# Patient Record
Sex: Male | Born: 1950 | Race: White | Hispanic: No | Marital: Married | Smoking: Never smoker
Health system: Southern US, Community
[De-identification: ages and names within clinical notes are randomized; demographics above are authoritative.]

## PROBLEM LIST (undated history)

## (undated) DIAGNOSIS — I639 Cerebral infarction, unspecified: Secondary | ICD-10-CM

## (undated) DIAGNOSIS — I10 Essential (primary) hypertension: Secondary | ICD-10-CM

## (undated) DIAGNOSIS — E119 Type 2 diabetes mellitus without complications: Secondary | ICD-10-CM

## (undated) HISTORY — PX: BRAIN SURGERY: SHX531

## (undated) HISTORY — PX: CARDIAC SURGERY: SHX584

---

## 2005-08-02 ENCOUNTER — Emergency Department: Payer: Self-pay | Admitting: Unknown Physician Specialty

## 2005-11-27 ENCOUNTER — Inpatient Hospital Stay: Payer: Self-pay | Admitting: Internal Medicine

## 2005-11-27 ENCOUNTER — Other Ambulatory Visit: Payer: Self-pay

## 2006-01-22 ENCOUNTER — Encounter: Payer: Self-pay | Admitting: Cardiology

## 2006-02-22 ENCOUNTER — Encounter: Payer: Self-pay | Admitting: Cardiology

## 2006-03-24 ENCOUNTER — Encounter: Payer: Self-pay | Admitting: Cardiology

## 2006-04-24 ENCOUNTER — Encounter: Payer: Self-pay | Admitting: Cardiology

## 2006-08-09 ENCOUNTER — Ambulatory Visit: Payer: Self-pay | Admitting: Unknown Physician Specialty

## 2006-09-04 ENCOUNTER — Ambulatory Visit: Payer: Self-pay | Admitting: Cardiology

## 2006-11-04 ENCOUNTER — Ambulatory Visit: Payer: Self-pay | Admitting: Internal Medicine

## 2006-11-12 ENCOUNTER — Ambulatory Visit: Payer: Self-pay | Admitting: Internal Medicine

## 2007-06-29 ENCOUNTER — Other Ambulatory Visit: Payer: Self-pay

## 2007-06-29 ENCOUNTER — Observation Stay: Payer: Self-pay | Admitting: Internal Medicine

## 2008-05-31 ENCOUNTER — Ambulatory Visit: Payer: Self-pay | Admitting: Family Medicine

## 2010-11-14 ENCOUNTER — Other Ambulatory Visit: Payer: Self-pay | Admitting: Physician Assistant

## 2011-05-31 ENCOUNTER — Ambulatory Visit: Payer: Self-pay | Admitting: Family Medicine

## 2012-04-04 ENCOUNTER — Ambulatory Visit: Payer: Self-pay | Admitting: Emergency Medicine

## 2012-04-04 LAB — URINALYSIS, COMPLETE
Bacteria: NEGATIVE
Blood: NEGATIVE
Ketone: NEGATIVE
Ph: 5 (ref 4.5–8.0)

## 2012-04-04 LAB — DOT URINE DIP
Blood: NEGATIVE
Glucose,UR: NEGATIVE mg/dL (ref 0–75)
Specific Gravity: >=1.03 (ref 1.003–1.030)

## 2018-08-27 ENCOUNTER — Inpatient Hospital Stay
Admission: EM | Admit: 2018-08-27 | Discharge: 2018-08-31 | DRG: 682 | Disposition: A | Discharge status: Other Acute Inpatient Hospital | Payer: Medicare Other | Attending: Internal Medicine | Admitting: Internal Medicine

## 2018-08-27 ENCOUNTER — Encounter: Payer: Self-pay | Admitting: Emergency Medicine

## 2018-08-27 ENCOUNTER — Emergency Department: Payer: Medicare Other

## 2018-08-27 ENCOUNTER — Other Ambulatory Visit: Payer: Self-pay

## 2018-08-27 ENCOUNTER — Inpatient Hospital Stay: Payer: Medicare Other

## 2018-08-27 DIAGNOSIS — Z79899 Other long term (current) drug therapy: Secondary | ICD-10-CM

## 2018-08-27 DIAGNOSIS — F05 Delirium due to known physiological condition: Secondary | ICD-10-CM | POA: Diagnosis not present

## 2018-08-27 DIAGNOSIS — Z794 Long term (current) use of insulin: Secondary | ICD-10-CM | POA: Diagnosis not present

## 2018-08-27 DIAGNOSIS — N179 Acute kidney failure, unspecified: Secondary | ICD-10-CM | POA: Diagnosis present

## 2018-08-27 DIAGNOSIS — N170 Acute kidney failure with tubular necrosis: Principal | ICD-10-CM | POA: Diagnosis present

## 2018-08-27 DIAGNOSIS — N3000 Acute cystitis without hematuria: Secondary | ICD-10-CM | POA: Diagnosis present

## 2018-08-27 DIAGNOSIS — E860 Dehydration: Secondary | ICD-10-CM | POA: Diagnosis present

## 2018-08-27 DIAGNOSIS — E870 Hyperosmolality and hypernatremia: Secondary | ICD-10-CM | POA: Diagnosis not present

## 2018-08-27 DIAGNOSIS — Z6822 Body mass index (BMI) 22.0-22.9, adult: Secondary | ICD-10-CM

## 2018-08-27 DIAGNOSIS — I639 Cerebral infarction, unspecified: Secondary | ICD-10-CM

## 2018-08-27 DIAGNOSIS — G9341 Metabolic encephalopathy: Secondary | ICD-10-CM | POA: Diagnosis present

## 2018-08-27 DIAGNOSIS — R6521 Severe sepsis with septic shock: Secondary | ICD-10-CM | POA: Diagnosis present

## 2018-08-27 DIAGNOSIS — I10 Essential (primary) hypertension: Secondary | ICD-10-CM | POA: Diagnosis present

## 2018-08-27 DIAGNOSIS — E872 Acidosis, unspecified: Secondary | ICD-10-CM

## 2018-08-27 DIAGNOSIS — Z800 Family history of malignant neoplasm of digestive organs: Secondary | ICD-10-CM | POA: Diagnosis not present

## 2018-08-27 DIAGNOSIS — E1140 Type 2 diabetes mellitus with diabetic neuropathy, unspecified: Secondary | ICD-10-CM | POA: Diagnosis present

## 2018-08-27 DIAGNOSIS — G911 Obstructive hydrocephalus: Secondary | ICD-10-CM | POA: Diagnosis not present

## 2018-08-27 DIAGNOSIS — E875 Hyperkalemia: Secondary | ICD-10-CM | POA: Diagnosis present

## 2018-08-27 DIAGNOSIS — E43 Unspecified severe protein-calorie malnutrition: Secondary | ICD-10-CM

## 2018-08-27 DIAGNOSIS — R569 Unspecified convulsions: Secondary | ICD-10-CM | POA: Diagnosis not present

## 2018-08-27 DIAGNOSIS — Z7982 Long term (current) use of aspirin: Secondary | ICD-10-CM | POA: Diagnosis not present

## 2018-08-27 DIAGNOSIS — I69334 Monoplegia of upper limb following cerebral infarction affecting left non-dominant side: Secondary | ICD-10-CM

## 2018-08-27 DIAGNOSIS — E876 Hypokalemia: Secondary | ICD-10-CM | POA: Diagnosis not present

## 2018-08-27 DIAGNOSIS — J9601 Acute respiratory failure with hypoxia: Secondary | ICD-10-CM | POA: Diagnosis present

## 2018-08-27 DIAGNOSIS — Z841 Family history of disorders of kidney and ureter: Secondary | ICD-10-CM | POA: Diagnosis not present

## 2018-08-27 DIAGNOSIS — F329 Major depressive disorder, single episode, unspecified: Secondary | ICD-10-CM | POA: Diagnosis present

## 2018-08-27 DIAGNOSIS — K920 Hematemesis: Secondary | ICD-10-CM | POA: Diagnosis present

## 2018-08-27 DIAGNOSIS — J9600 Acute respiratory failure, unspecified whether with hypoxia or hypercapnia: Secondary | ICD-10-CM

## 2018-08-27 DIAGNOSIS — F0631 Mood disorder due to known physiological condition with depressive features: Secondary | ICD-10-CM | POA: Diagnosis not present

## 2018-08-27 DIAGNOSIS — R112 Nausea with vomiting, unspecified: Secondary | ICD-10-CM | POA: Diagnosis not present

## 2018-08-27 HISTORY — DX: Cerebral infarction, unspecified: I63.9

## 2018-08-27 HISTORY — DX: Essential (primary) hypertension: I10

## 2018-08-27 HISTORY — DX: Type 2 diabetes mellitus without complications: E11.9

## 2018-08-27 LAB — COMPREHENSIVE METABOLIC PANEL
ALT: 19 U/L (ref 0–44)
AST: 25 U/L (ref 15–41)
Albumin: 3.8 g/dL (ref 3.5–5.0)
Alkaline Phosphatase: 128 U/L — ABNORMAL HIGH (ref 38–126)
Anion gap: 28 — ABNORMAL HIGH (ref 5–15)
BUN: 95 mg/dL — ABNORMAL HIGH (ref 8–23)
CO2: 13 mmol/L — ABNORMAL LOW (ref 22–32)
Calcium: 8.9 mg/dL (ref 8.9–10.3)
Chloride: 105 mmol/L (ref 98–111)
Creatinine, Ser: 6.38 mg/dL — ABNORMAL HIGH (ref 0.61–1.24)
GFR calc Af Amer: 10 mL/min — ABNORMAL LOW (ref >60)
GFR calc non Af Amer: 8 mL/min — ABNORMAL LOW (ref >60)
Glucose, Bld: 135 mg/dL — ABNORMAL HIGH (ref 70–99)
Potassium: 6.8 mmol/L (ref 3.5–5.1)
Sodium: 146 mmol/L — ABNORMAL HIGH (ref 135–145)
TOTAL PROTEIN: 7.1 g/dL (ref 6.5–8.1)
Total Bilirubin: 0.8 mg/dL (ref 0.3–1.2)

## 2018-08-27 LAB — CBC WITH DIFFERENTIAL/PLATELET
Abs Immature Granulocytes: 0.05 10*3/uL (ref 0.00–0.07)
BASOS PCT: 0 %
Basophils Absolute: 0 10*3/uL (ref 0.0–0.1)
EOS ABS: 0 10*3/uL (ref 0.0–0.5)
Eosinophils Relative: 0 %
HCT: 56 % — ABNORMAL HIGH (ref 39.0–52.0)
Hemoglobin: 16.3 g/dL (ref 13.0–17.0)
Immature Granulocytes: 1 %
Lymphocytes Relative: 9 %
Lymphs Abs: 0.9 10*3/uL (ref 0.7–4.0)
MCH: 28.4 pg (ref 26.0–34.0)
MCHC: 29.1 g/dL — ABNORMAL LOW (ref 30.0–36.0)
MCV: 97.6 fL (ref 80.0–100.0)
Monocytes Absolute: 0.5 10*3/uL (ref 0.1–1.0)
Monocytes Relative: 5 %
Neutro Abs: 8.9 10*3/uL — ABNORMAL HIGH (ref 1.7–7.7)
Neutrophils Relative %: 85 %
Platelets: 298 10*3/uL (ref 150–400)
RBC: 5.74 MIL/uL (ref 4.22–5.81)
RDW: 15.5 % (ref 11.5–15.5)
WBC: 10.4 10*3/uL (ref 4.0–10.5)
nRBC: 0 % (ref 0.0–0.2)

## 2018-08-27 LAB — BASIC METABOLIC PANEL
Anion gap: 26 — ABNORMAL HIGH (ref 5–15)
BUN: 87 mg/dL — ABNORMAL HIGH (ref 8–23)
CO2: 12 mmol/L — ABNORMAL LOW (ref 22–32)
Calcium: 8.1 mg/dL — ABNORMAL LOW (ref 8.9–10.3)
Chloride: 112 mmol/L — ABNORMAL HIGH (ref 98–111)
Creatinine, Ser: 5.88 mg/dL — ABNORMAL HIGH (ref 0.61–1.24)
GFR calc Af Amer: 11 mL/min — ABNORMAL LOW (ref >60)
GFR, EST NON AFRICAN AMERICAN: 9 mL/min — AB (ref >60)
Glucose, Bld: 96 mg/dL (ref 70–99)
POTASSIUM: 6.3 mmol/L — AB (ref 3.5–5.1)
Sodium: 150 mmol/L — ABNORMAL HIGH (ref 135–145)

## 2018-08-27 LAB — TROPONIN I: Troponin I: <0.03 ng/mL (ref <0.03)

## 2018-08-27 LAB — URINALYSIS, COMPLETE (UACMP) WITH MICROSCOPIC
Bacteria, UA: NONE SEEN
Bilirubin Urine: NEGATIVE
Glucose, UA: NEGATIVE mg/dL
Ketones, ur: NEGATIVE mg/dL
Nitrite: NEGATIVE
PH: 5 (ref 5.0–8.0)
Protein, ur: NEGATIVE mg/dL
Specific Gravity, Urine: 1.02 (ref 1.005–1.030)

## 2018-08-27 LAB — BLOOD GAS, VENOUS
Acid-base deficit: 14 mmol/L — ABNORMAL HIGH (ref 0.0–2.0)
Bicarbonate: 12.9 mmol/L — ABNORMAL LOW (ref 20.0–28.0)
O2 Saturation: 45.6 %
Patient temperature: 37
pCO2, Ven: 33 mmHg — ABNORMAL LOW (ref 44.0–60.0)
pH, Ven: 7.2 — ABNORMAL LOW (ref 7.250–7.430)
pO2, Ven: 32 mmHg (ref 32.0–45.0)

## 2018-08-27 LAB — CG4 I-STAT (LACTIC ACID): Lactic Acid, Venous: 7.33 mmol/L (ref 0.5–1.9)

## 2018-08-27 LAB — GLUCOSE, CAPILLARY
Glucose-Capillary: 74 mg/dL (ref 70–99)
Glucose-Capillary: 86 mg/dL (ref 70–99)

## 2018-08-27 LAB — LACTIC ACID, PLASMA: LACTIC ACID, VENOUS: 3.9 mmol/L — AB (ref 0.5–1.9)

## 2018-08-27 LAB — HEMOGLOBIN AND HEMATOCRIT, BLOOD
HCT: 45.3 % (ref 39.0–52.0)
Hemoglobin: 13.4 g/dL (ref 13.0–17.0)

## 2018-08-27 LAB — PROCALCITONIN: Procalcitonin: 0.37 ng/mL

## 2018-08-27 MED ORDER — INSULIN ASPART 100 UNIT/ML ~~LOC~~ SOLN
0.0000 [IU] | Freq: Three times a day (TID) | SUBCUTANEOUS | Status: DC
  Administered 2018-08-28: 5 [IU] via SUBCUTANEOUS
  Administered 2018-08-28: 7 [IU] via SUBCUTANEOUS
  Filled 2018-08-27 (×2): qty 1

## 2018-08-27 MED ORDER — HALOPERIDOL LACTATE 5 MG/ML IJ SOLN: 2.5000 mg | Freq: Four times a day (QID) | INTRAMUSCULAR | Status: DC | PRN

## 2018-08-27 MED ORDER — CALCIUM GLUCONATE-NACL 1-0.675 GM/50ML-% IV SOLN
1.0000 g | Freq: Once | INTRAVENOUS | Status: AC
  Administered 2018-08-27: 1000 mg via INTRAVENOUS
  Filled 2018-08-27: qty 50

## 2018-08-27 MED ORDER — PATIROMER SORBITEX CALCIUM 8.4 G PO PACK
8.4000 g | PACK | Freq: Every day | ORAL | Status: DC
  Filled 2018-08-27: qty 1

## 2018-08-27 MED ORDER — SODIUM CHLORIDE 0.9 % IV BOLUS
1000.0000 mL | Freq: Once | INTRAVENOUS | Status: AC
  Administered 2018-08-27: 1000 mL via INTRAVENOUS

## 2018-08-27 MED ORDER — ONDANSETRON HCL 4 MG/2ML IJ SOLN
4.0000 mg | Freq: Four times a day (QID) | INTRAMUSCULAR | Status: DC | PRN
  Administered 2018-08-27 – 2018-08-30 (×5): 4 mg via INTRAVENOUS
  Filled 2018-08-27 (×5): qty 2

## 2018-08-27 MED ORDER — SODIUM CHLORIDE 0.9 % IV SOLN: Freq: Once | INTRAVENOUS | Status: DC

## 2018-08-27 MED ORDER — SODIUM CHLORIDE 0.45 % IV SOLN
INTRAVENOUS | Status: DC
  Administered 2018-08-27: via INTRAVENOUS

## 2018-08-27 MED ORDER — VANCOMYCIN HCL IN DEXTROSE 1-5 GM/200ML-% IV SOLN
1000.0000 mg | Freq: Once | INTRAVENOUS | Status: AC
  Administered 2018-08-27: 1000 mg via INTRAVENOUS
  Filled 2018-08-27 (×3): qty 200

## 2018-08-27 MED ORDER — VANCOMYCIN HCL IN DEXTROSE 1-5 GM/200ML-% IV SOLN
1000.0000 mg | INTRAVENOUS | Status: DC
  Filled 2018-08-27: qty 200

## 2018-08-27 MED ORDER — ACETAMINOPHEN 650 MG RE SUPP: 650.0000 mg | Freq: Four times a day (QID) | RECTAL | Status: DC | PRN

## 2018-08-27 MED ORDER — SODIUM CHLORIDE 0.9 % IV SOLN
1.0000 g | Freq: Once | INTRAVENOUS | Status: DC
  Filled 2018-08-27: qty 10

## 2018-08-27 MED ORDER — SODIUM CHLORIDE 0.9 % IV SOLN
Freq: Once | INTRAVENOUS | Status: AC
  Administered 2018-08-27: 14:00:00 via INTRAVENOUS

## 2018-08-27 MED ORDER — ACETAMINOPHEN 325 MG PO TABS: 650.0000 mg | ORAL_TABLET | Freq: Four times a day (QID) | ORAL | Status: DC | PRN

## 2018-08-27 MED ORDER — TAMSULOSIN HCL 0.4 MG PO CAPS
0.4000 mg | ORAL_CAPSULE | Freq: Every day | ORAL | Status: DC
  Administered 2018-08-29 – 2018-08-30 (×2): 0.4 mg via ORAL
  Filled 2018-08-27 (×2): qty 1

## 2018-08-27 MED ORDER — INSULIN ASPART 100 UNIT/ML ~~LOC~~ SOLN: 0.0000 [IU] | Freq: Every day | SUBCUTANEOUS | Status: DC

## 2018-08-27 MED ORDER — SODIUM CHLORIDE 0.9% IV SOLUTION: Freq: Once | INTRAVENOUS | Status: DC

## 2018-08-27 MED ORDER — SODIUM BICARBONATE 8.4 % IV SOLN
100.0000 meq | Freq: Once | INTRAVENOUS | Status: AC
  Administered 2018-08-27: 100 meq via INTRAVENOUS
  Filled 2018-08-27: qty 100

## 2018-08-27 MED ORDER — SODIUM ZIRCONIUM CYCLOSILICATE 10 G PO PACK
10.0000 g | PACK | Freq: Three times a day (TID) | ORAL | Status: DC
  Filled 2018-08-27 (×6): qty 1

## 2018-08-27 MED ORDER — SODIUM ZIRCONIUM CYCLOSILICATE 10 G PO PACK
10.0000 g | PACK | Freq: Two times a day (BID) | ORAL | Status: DC
  Filled 2018-08-27 (×2): qty 1

## 2018-08-27 MED ORDER — INSULIN REGULAR HUMAN 100 UNIT/ML IJ SOLN
10.0000 [IU] | Freq: Once | INTRAMUSCULAR | Status: AC
  Administered 2018-08-27: 10 [IU] via INTRAVENOUS
  Filled 2018-08-27: qty 10

## 2018-08-27 MED ORDER — PANTOPRAZOLE SODIUM 40 MG IV SOLR
40.0000 mg | Freq: Once | INTRAVENOUS | Status: AC
  Administered 2018-08-27: 40 mg via INTRAVENOUS
  Filled 2018-08-27: qty 40

## 2018-08-27 MED ORDER — PIPERACILLIN-TAZOBACTAM 3.375 G IVPB 30 MIN
3.3750 g | Freq: Once | INTRAVENOUS | Status: AC
  Administered 2018-08-27: 3.375 g via INTRAVENOUS
  Filled 2018-08-27: qty 50

## 2018-08-27 MED ORDER — DEXTROSE 50 % IV SOLN
1.0000 | Freq: Once | INTRAVENOUS | Status: AC
  Administered 2018-08-27: 50 mL via INTRAVENOUS
  Filled 2018-08-27: qty 50

## 2018-08-27 MED ORDER — SODIUM BICARBONATE 8.4 % IV SOLN
INTRAVENOUS | Status: DC
  Administered 2018-08-27: via INTRAVENOUS
  Filled 2018-08-27 (×3): qty 150

## 2018-08-27 MED ORDER — SODIUM CHLORIDE 0.9 % IV SOLN
2.0000 g | INTRAVENOUS | Status: DC
  Administered 2018-08-28 (×2): 2 g via INTRAVENOUS
  Filled 2018-08-27 (×3): qty 2

## 2018-08-27 MED ORDER — NOREPINEPHRINE 4 MG/250ML-% IV SOLN
INTRAVENOUS | Status: AC
  Administered 2018-08-27: 7 ug/min via INTRAVENOUS
  Filled 2018-08-27: qty 250

## 2018-08-27 MED ORDER — SODIUM POLYSTYRENE SULFONATE 15 GM/60ML PO SUSP
45.0000 g | Freq: Once | ORAL | Status: AC
  Administered 2018-08-27: 45 g via RECTAL
  Filled 2018-08-27: qty 180

## 2018-08-27 MED ORDER — DEXTROSE 50 % IV SOLN
25.0000 g | Freq: Once | INTRAVENOUS | Status: AC
  Administered 2018-08-27: 25 g via INTRAVENOUS
  Filled 2018-08-27: qty 50

## 2018-08-27 MED ORDER — SODIUM BICARBONATE 8.4 % IV SOLN
50.0000 meq | Freq: Once | INTRAVENOUS | Status: AC
  Administered 2018-08-27: 50 meq via INTRAVENOUS
  Filled 2018-08-27: qty 50

## 2018-08-27 MED ORDER — ONDANSETRON HCL 4 MG/2ML IJ SOLN
4.0000 mg | Freq: Once | INTRAMUSCULAR | Status: AC
  Administered 2018-08-27: 4 mg via INTRAVENOUS
  Filled 2018-08-27: qty 2

## 2018-08-27 MED ORDER — INSULIN ASPART 100 UNIT/ML ~~LOC~~ SOLN
10.0000 [IU] | Freq: Once | SUBCUTANEOUS | Status: AC
  Administered 2018-08-27: 10 [IU] via INTRAVENOUS
  Filled 2018-08-27: qty 1

## 2018-08-27 MED ORDER — SODIUM CHLORIDE 0.9 % IV SOLN
INTRAVENOUS | Status: DC
  Administered 2018-08-27: 17:00:00 via INTRAVENOUS

## 2018-08-27 MED ORDER — NOREPINEPHRINE 4 MG/250ML-% IV SOLN
0.0000 ug/min | INTRAVENOUS | Status: DC
  Administered 2018-08-27: 7 ug/min via INTRAVENOUS
  Administered 2018-08-28: 6 ug/min via INTRAVENOUS
  Administered 2018-08-29: 3 ug/min via INTRAVENOUS
  Filled 2018-08-27 (×2): qty 250

## 2018-08-27 MED ORDER — ONDANSETRON HCL 4 MG PO TABS: 4.0000 mg | ORAL_TABLET | Freq: Four times a day (QID) | ORAL | Status: DC | PRN

## 2018-08-27 MED ORDER — PANTOPRAZOLE SODIUM 40 MG IV SOLR
40.0000 mg | Freq: Two times a day (BID) | INTRAVENOUS | Status: DC
  Administered 2018-08-27 – 2018-08-31 (×9): 40 mg via INTRAVENOUS
  Filled 2018-08-27 (×9): qty 40

## 2018-08-27 NOTE — ED Notes (Signed)
US at bedside

## 2018-08-27 NOTE — Progress Notes (Signed)
CODE SEPSIS - PHARMACY COMMUNICATION  **Broad Spectrum Antibiotics should be administered within 1 hour of Sepsis diagnosis**  Time Code Sepsis Called/Page Received: 1322  Antibiotics Ordered: Vancomycin + Zosyn  Time of 1st antibiotic administration: 1351  Additional action taken by pharmacy: N/A  If necessary, Name of Provider/Nurse Contacted: N/A   Mauri ReadingSavanna M Martin, PharmD Pharmacy Resident  08/27/2018 1:22 PM
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
## 2018-08-27 NOTE — ED Notes (Signed)
Patient with dark emesis, zofran given

## 2018-08-27 NOTE — Consult Note (Addendum)
Pharmacy Antibiotic Note  Alan Newman is a 67 y.o. male admitted on 08/27/2018 with sepsis. Pharmacy has been consulted for Vancomycin/Cefepime dosing.  Plan: Vancomycin 1000 IV every 48 hours.  Goal trough 15-20 mcg/mL.   Cefepime 2g q 24hrs  Stacked dosing calculation indicated 2nd dose should be given 48 hours after the first  Ke=0.015 t /12=46.21 vd=53.8  Patient is in acute renal failure and creatinine clearance improvements will need to be monitored closely and dosing adjusted accordingly   Height: 6' 0.01" (182.9 cm) Weight: 169 lb 5 oz (76.8 kg) IBW/kg (Calculated) : 77.62  Temp (24hrs), Avg:97.8 F (36.6 C), Min:97.8 F (36.6 C), Max:97.8 F (36.6 C)  Recent Labs  Lab 08/27/18 1323 08/27/18 1329  WBC 10.4  --   CREATININE 6.38*  --   LATICACIDVEN  --  7.33*    Estimated Creatinine Clearance: 12.5 mL/min (A) (by C-G formula based on SCr of 6.38 mg/dL (H)).    No Known Allergies  Antimicrobials this admission: Zosyn 12/04 >> 12/04 Vancomycin 12/04 >>  Cefepime 12/04 >>   Dose adjustments this admission: N/A  Microbiology results: 12/04 BCx: pending 12/04 UCx: pending     Thank you for allowing pharmacy to be a part of this patient's care.  Alan Newman 08/27/2018 8:07 PM

## 2018-08-27 NOTE — ED Notes (Signed)
Date and time results received: 08/27/18 2025 (use smartphrase ".now" to insert current time)  Test: 6.3 Critical Value: Potassium  Name of Provider Notified: Dr. Cherlynn KaiserSainani   Orders Received? Or Actions Taken?: Provider aware
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
## 2018-08-27 NOTE — Progress Notes (Signed)
Patient continues to be persistently hypotensive despite getting aggressive IV fluid resuscitation.  Patient has received 3 L of IV normal saline and remains hypotensive.  Will start some Levophed.  Discussed with the ER physician and she will help in trying to place a central line.  On admission patient was suspected to have more hypovolemic shock with acute kidney injury but as he continues to be hypotensive and his urine looks quite cloudy I will continue to treat him for underlying sepsis with broad-spectrum IV antibiotics for now.  Patient initially received IV vancomycin and Zosyn x1 dose. - I have placed consult for IV Vanco and IV cefepime with pharmacy for sepsis.

## 2018-08-27 NOTE — ED Notes (Signed)
Pharmacy delay in vanc

## 2018-08-27 NOTE — ED Notes (Signed)
Levophed started. BMP redrawn and sent to lab.

## 2018-08-27 NOTE — ED Notes (Signed)
Dr. Cherlynn KaiserSainani notifed of repeat lactic, order discontinued for sepsis alert.

## 2018-08-27 NOTE — ED Notes (Signed)
Date and time results received: 08/27/18 1405 (use smartphrase ".now" to insert current time)  Test: K Critical Value: 6.8  Name of Provider Notified: Mayford KnifeWilliams

## 2018-08-27 NOTE — Procedures (Signed)
Central Venous Catheter Insertion Procedure Note Alan ShellingWilliam L Newman [id number] 11/20/1950  Procedure: Insertion of Central Venous Catheter Indications: Assessment of intravascular volume, Drug and/or fluid administration and Frequent blood sampling  Procedure Details Consent: Unable to obtain consent because of altered level of consciousness. Time Out: Verified patient identification, verified procedure, site/side was marked, verified correct patient position, special equipment/implants available, medications/allergies/relevent history reviewed, required imaging and test results available.  Performed  Maximum sterile technique was used including antiseptics, cap, gloves, gown, hand hygiene, mask and sheet. Skin prep: Chlorhexidine; local anesthetic administered A antimicrobial bonded/coated triple lumen catheter was placed in the left femoral vein due to emergent situation using the Seldinger technique.  Evaluation Blood flow good Complications: No apparent complications Patient did tolerate procedure well. Chest X-ray ordered to verify placement.  CXR: Not applicable, placed in left femoral vein.   Procedure was performed using Ultrasound for direct visualization of cannulization of left femoral vein.     Alan Newman, AGACNP-BC [facility] Pager: [phone] Cell: [phone]   Alan Newman 08/27/2018, 9:38 PM

## 2018-08-27 NOTE — ED Notes (Signed)
Patient cleaned of bowel movement. Patient blood pressure 80/60, Dr. Cherlynn KaiserSainani at bedside. Patient agitated and pulling at lines.

## 2018-08-27 NOTE — ED Notes (Signed)
Patient transported to CT 

## 2018-08-27 NOTE — ED Notes (Signed)
Patients blood pressure 77/60. Dr. Cherlynn KaiserSainani paged. Verbal order for 1L bolus given. IL hung at this time.
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
## 2018-08-27 NOTE — ED Notes (Signed)
Kassie RN, aware of bed assigned  
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
## 2018-08-27 NOTE — ED Triage Notes (Signed)
Pt to ED via EMS from [facility] with c/o coffee ground emesis today. Pt appears lethargic and pale. MD at bedside. EMS states 88%^ on RA, other VSS.

## 2018-08-28 ENCOUNTER — Inpatient Hospital Stay: Payer: Medicare Other

## 2018-08-28 ENCOUNTER — Other Ambulatory Visit: Payer: Self-pay

## 2018-08-28 DIAGNOSIS — J9601 Acute respiratory failure with hypoxia: Secondary | ICD-10-CM

## 2018-08-28 LAB — BLOOD GAS, VENOUS
ACID-BASE DEFICIT: 12.6 mmol/L — AB (ref 0.0–2.0)
ACID-BASE DEFICIT: 1.5 mmol/L (ref 0.0–2.0)
ACID-BASE EXCESS: 7.6 mmol/L — AB (ref 0.0–2.0)
Acid-Base Excess: 3 mmol/L — ABNORMAL HIGH (ref 0.0–2.0)
Acid-base deficit: 7.1 mmol/L — ABNORMAL HIGH (ref 0.0–2.0)
BICARBONATE: 29 mmol/L — AB (ref 20.0–28.0)
Bicarbonate: 13 mmol/L — ABNORMAL LOW (ref 20.0–28.0)
Bicarbonate: 17.9 mmol/L — ABNORMAL LOW (ref 20.0–28.0)
Bicarbonate: 23.7 mmol/L (ref 20.0–28.0)
Bicarbonate: 33.7 mmol/L — ABNORMAL HIGH (ref 20.0–28.0)
FIO2: 21
FIO2: 21
O2 SAT: 73.1 %
O2 Saturation: 81.9 %
O2 Saturation: 84.3 %
O2 Saturation: 69.3 %
O2 Saturation: 70.3 %
PCO2 VEN: 34 mmHg — AB (ref 44.0–60.0)
Patient temperature: 37
Patient temperature: 37
Patient temperature: 37
Patient temperature: 37
Patient temperature: 37
pCO2, Ven: 29 mmHg — ABNORMAL LOW (ref 44.0–60.0)
pCO2, Ven: 41 mmHg — ABNORMAL LOW (ref 44.0–60.0)
pCO2, Ven: 49 mmHg (ref 44.0–60.0)
pCO2, Ven: 52 mmHg (ref 44.0–60.0)
pH, Ven: 7.26 (ref 7.250–7.430)
pH, Ven: 7.33 (ref 7.250–7.430)
pH, Ven: 7.37 (ref 7.250–7.430)
pH, Ven: 7.38 (ref 7.250–7.430)
pH, Ven: 7.42 (ref 7.250–7.430)
pO2, Ven: 54 mmHg — ABNORMAL HIGH (ref 32.0–45.0)
pO2, Ven: 53 mmHg — ABNORMAL HIGH (ref 32.0–45.0)
pO2, Ven: 40 mmHg (ref 32.0–45.0)
pO2, Ven: 37 mmHg (ref 32.0–45.0)
pO2, Ven: 36 mmHg (ref 32.0–45.0)

## 2018-08-28 LAB — BASIC METABOLIC PANEL
Anion gap: 20 — ABNORMAL HIGH (ref 5–15)
Anion gap: 19 — ABNORMAL HIGH (ref 5–15)
Anion gap: 18 — ABNORMAL HIGH (ref 5–15)
Anion gap: 14 (ref 5–15)
BUN: 85 mg/dL — ABNORMAL HIGH (ref 8–23)
BUN: 82 mg/dL — ABNORMAL HIGH (ref 8–23)
BUN: 75 mg/dL — ABNORMAL HIGH (ref 8–23)
BUN: 72 mg/dL — ABNORMAL HIGH (ref 8–23)
CALCIUM: 7.4 mg/dL — AB (ref 8.9–10.3)
CALCIUM: 7.5 mg/dL — AB (ref 8.9–10.3)
CO2: 19 mmol/L — ABNORMAL LOW (ref 22–32)
CO2: 19 mmol/L — ABNORMAL LOW (ref 22–32)
CO2: 19 mmol/L — ABNORMAL LOW (ref 22–32)
CO2: 23 mmol/L (ref 22–32)
Calcium: 7.7 mg/dL — ABNORMAL LOW (ref 8.9–10.3)
Calcium: 7.2 mg/dL — ABNORMAL LOW (ref 8.9–10.3)
Chloride: 113 mmol/L — ABNORMAL HIGH (ref 98–111)
Chloride: 110 mmol/L (ref 98–111)
Chloride: 110 mmol/L (ref 98–111)
Chloride: 112 mmol/L — ABNORMAL HIGH (ref 98–111)
Creatinine, Ser: 5.21 mg/dL — ABNORMAL HIGH (ref 0.61–1.24)
Creatinine, Ser: 4.79 mg/dL — ABNORMAL HIGH (ref 0.61–1.24)
Creatinine, Ser: 4.22 mg/dL — ABNORMAL HIGH (ref 0.61–1.24)
Creatinine, Ser: 3.82 mg/dL — ABNORMAL HIGH (ref 0.61–1.24)
GFR calc Af Amer: 12 mL/min — ABNORMAL LOW (ref >60)
GFR calc Af Amer: 16 mL/min — ABNORMAL LOW (ref >60)
GFR calc Af Amer: 18 mL/min — ABNORMAL LOW (ref >60)
GFR calc non Af Amer: 11 mL/min — ABNORMAL LOW (ref >60)
GFR calc non Af Amer: 12 mL/min — ABNORMAL LOW (ref >60)
GFR calc non Af Amer: 14 mL/min — ABNORMAL LOW (ref >60)
GFR calc non Af Amer: 16 mL/min — ABNORMAL LOW (ref >60)
GFR, EST AFRICAN AMERICAN: 14 mL/min — AB (ref >60)
Glucose, Bld: 157 mg/dL — ABNORMAL HIGH (ref 70–99)
Glucose, Bld: 345 mg/dL — ABNORMAL HIGH (ref 70–99)
Glucose, Bld: 380 mg/dL — ABNORMAL HIGH (ref 70–99)
Glucose, Bld: 318 mg/dL — ABNORMAL HIGH (ref 70–99)
POTASSIUM: 4.9 mmol/L (ref 3.5–5.1)
Potassium: 5 mmol/L (ref 3.5–5.1)
Potassium: 4.8 mmol/L (ref 3.5–5.1)
Potassium: 4.4 mmol/L (ref 3.5–5.1)
SODIUM: 149 mmol/L — AB (ref 135–145)
Sodium: 152 mmol/L — ABNORMAL HIGH (ref 135–145)
Sodium: 148 mmol/L — ABNORMAL HIGH (ref 135–145)
Sodium: 147 mmol/L — ABNORMAL HIGH (ref 135–145)

## 2018-08-28 LAB — HEMOGLOBIN AND HEMATOCRIT, BLOOD
HCT: 40.9 % (ref 39.0–52.0)
HCT: 38.8 % — ABNORMAL LOW (ref 39.0–52.0)
HCT: 42.9 % (ref 39.0–52.0)
HCT: 40.6 % (ref 39.0–52.0)
Hemoglobin: 12.5 g/dL — ABNORMAL LOW (ref 13.0–17.0)
Hemoglobin: 11.8 g/dL — ABNORMAL LOW (ref 13.0–17.0)
Hemoglobin: 13.1 g/dL (ref 13.0–17.0)
Hemoglobin: 12.8 g/dL — ABNORMAL LOW (ref 13.0–17.0)

## 2018-08-28 LAB — CBC
HCT: 41.7 % (ref 39.0–52.0)
Hemoglobin: 12.6 g/dL — ABNORMAL LOW (ref 13.0–17.0)
MCH: 28.8 pg (ref 26.0–34.0)
MCHC: 30.2 g/dL (ref 30.0–36.0)
MCV: 95.2 fL (ref 80.0–100.0)
Platelets: 164 10*3/uL (ref 150–400)
RBC: 4.38 MIL/uL (ref 4.22–5.81)
RDW: 15.6 % — ABNORMAL HIGH (ref 11.5–15.5)
WBC: 6.9 10*3/uL (ref 4.0–10.5)
nRBC: 0 % (ref 0.0–0.2)

## 2018-08-28 LAB — GLUCOSE, CAPILLARY
GLUCOSE-CAPILLARY: 253 mg/dL — AB (ref 70–99)
GLUCOSE-CAPILLARY: 225 mg/dL — AB (ref 70–99)
Glucose-Capillary: 306 mg/dL — ABNORMAL HIGH (ref 70–99)
Glucose-Capillary: 314 mg/dL — ABNORMAL HIGH (ref 70–99)
Glucose-Capillary: 275 mg/dL — ABNORMAL HIGH (ref 70–99)

## 2018-08-28 LAB — PREPARE RBC (CROSSMATCH)

## 2018-08-28 LAB — MRSA PCR SCREENING: MRSA by PCR: POSITIVE — AB

## 2018-08-28 LAB — MAGNESIUM: Magnesium: 1.5 mg/dL — ABNORMAL LOW (ref 1.7–2.4)

## 2018-08-28 LAB — PROCALCITONIN: Procalcitonin: 0.36 ng/mL

## 2018-08-28 LAB — LACTIC ACID, PLASMA: Lactic Acid, Venous: 2.1 mmol/L (ref 0.5–1.9)

## 2018-08-28 MED ORDER — INSULIN ASPART 100 UNIT/ML ~~LOC~~ SOLN
0.0000 [IU] | SUBCUTANEOUS | Status: DC
  Administered 2018-08-28: 3 [IU] via SUBCUTANEOUS
  Administered 2018-08-28: 5 [IU] via SUBCUTANEOUS
  Administered 2018-08-29: 3 [IU] via SUBCUTANEOUS
  Administered 2018-08-29: 2 [IU] via SUBCUTANEOUS
  Administered 2018-08-29: 5 [IU] via SUBCUTANEOUS
  Filled 2018-08-28 (×5): qty 1

## 2018-08-28 MED ORDER — DEXMEDETOMIDINE HCL IN NACL 400 MCG/100ML IV SOLN
0.4000 ug/kg/h | INTRAVENOUS | Status: DC
  Administered 2018-08-28: 1 ug/kg/h via INTRAVENOUS
  Administered 2018-08-28 (×3): 0.9 ug/kg/h via INTRAVENOUS
  Administered 2018-08-28: 0.5 ug/kg/h via INTRAVENOUS
  Filled 2018-08-28 (×4): qty 100

## 2018-08-28 MED ORDER — SODIUM BICARBONATE 8.4 % IV SOLN
INTRAVENOUS | Status: DC
  Administered 2018-08-28: 16:00:00 via INTRAVENOUS
  Filled 2018-08-28 (×3): qty 100

## 2018-08-28 MED ORDER — MUPIROCIN 2 % EX OINT
1.0000 "application " | TOPICAL_OINTMENT | Freq: Two times a day (BID) | CUTANEOUS | Status: DC
  Administered 2018-08-28 – 2018-08-31 (×7): 1 via NASAL
  Filled 2018-08-28 (×2): qty 22

## 2018-08-28 MED ORDER — INSULIN REGULAR(HUMAN) IN NACL 100-0.9 UT/100ML-% IV SOLN: INTRAVENOUS | Status: DC

## 2018-08-28 MED ORDER — DEXMEDETOMIDINE HCL IN NACL 400 MCG/100ML IV SOLN
INTRAVENOUS | Status: AC
  Administered 2018-08-28: 0.5 ug/kg/h via INTRAVENOUS
  Filled 2018-08-28: qty 100

## 2018-08-28 MED ORDER — INSULIN DETEMIR 100 UNIT/ML ~~LOC~~ SOLN
15.0000 [IU] | Freq: Every day | SUBCUTANEOUS | Status: DC
  Administered 2018-08-28 – 2018-08-30 (×3): 15 [IU] via SUBCUTANEOUS
  Filled 2018-08-28 (×4): qty 0.15

## 2018-08-28 MED ORDER — ORAL CARE MOUTH RINSE
15.0000 mL | Freq: Two times a day (BID) | OROMUCOSAL | Status: DC
  Administered 2018-08-28 – 2018-08-29 (×3): 15 mL via OROMUCOSAL

## 2018-08-28 MED ORDER — CHLORHEXIDINE GLUCONATE CLOTH 2 % EX PADS
6.0000 | MEDICATED_PAD | Freq: Every day | CUTANEOUS | Status: DC
  Administered 2018-08-28 – 2018-08-31 (×3): 6 via TOPICAL

## 2018-08-28 MED ORDER — DEXTROSE-NACL 5-0.45 % IV SOLN
INTRAVENOUS | Status: DC
  Administered 2018-08-28: 02:00:00 via INTRAVENOUS
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
## 2018-08-28 NOTE — Consult Note (Signed)
Pharmacy Antibiotic Note  Alan Newman is a 67 y.o. male admitted on 08/27/2018 with sepsis. Pharmacy has been consulted for Vancomycin/Cefepime dosing.  Plan: Vancomycin 1000 IV every 48 hours.  Goal trough 15-20 mcg/mL.   Cefepime 2g q 24hrs  Stacked dosing calculation indicated 2nd dose should be given 48 hours after the first  Ke=0.015 t /12=46.21 vd=53.8  Patient is in acute renal failure and creatinine clearance improvements will need to be monitored closely and dosing adjusted accordingly   Height: 6' (182.9 cm) Weight: 171 lb 11.8 oz (77.9 kg) IBW/kg (Calculated) : 77.6  Temp (24hrs), Avg:97.8 F (36.6 C), Min:97.7 F (36.5 C), Max:97.8 F (36.6 C)  Recent Labs  Lab 08/27/18 1323 08/27/18 1329 08/27/18 1946 08/27/18 2313 08/28/18 0049 08/28/18 0211 08/28/18 0528 08/28/18 0836  WBC 10.4  --   --   --   --   --  6.9  --   CREATININE 6.38*  --  5.88*  --  5.21*  --  4.79* 4.22*  LATICACIDVEN  --  7.33*  --  3.9*  --  2.1*  --   --     Estimated Creatinine Clearance: 19.2 mL/min (A) (by C-G formula based on SCr of 4.22 mg/dL (H)).    No Known Allergies  Antimicrobials this admission: Zosyn 12/04 >> 12/04 Vancomycin 12/04 >>  Cefepime 12/04 >>   Dose adjustments this admission: N/A  Microbiology results: 12/04 BCx: pending 12/04 UCx: >100,000 unidentified organism    Thank you for allowing pharmacy to be a part of this patient's care.  Clovia CuffLisa Greydis Stlouis, PharmD, BCPS 08/28/2018 11:53 AM
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
## 2018-08-28 NOTE — Progress Notes (Signed)
Inpatient Diabetes Program Recommendations  AACE/ADA: New Consensus Statement on Inpatient Glycemic Control (2019)  Target Ranges:  Prepandial:   less than 140 mg/dL      Peak postprandial:   less than 180 mg/dL (1-2 hours)      Critically ill patients:  140 - 180 mg/dL   Results for Alan ShellingMERRITT, [name] (MRN [id number]) as of 08/28/2018 13:06  Ref. Range 08/27/2018 18:55 08/27/2018 20:46 08/28/2018 06:04 08/28/2018 07:34 08/28/2018 12:15  Glucose-Capillary Latest Ref Range: 70 - 99 mg/dL 74 86 409306 (H) 811314 (H) 914275 (H)   Review of Glycemic Control  Diabetes history: DM2 Outpatient Diabetes medications: NPH 24 units QAM, NPH 20 untis QHS, Humalog TID (based on correction scale), Metformin 1000 mg BID Current orders for Inpatient glycemic control: Novolog 0-9 units TID with meals, Novolog 0-5 units QHS, IV insulin drip  Inpatient Diabetes Program Recommendations:  IV insulin: Per communication with RN, IV insulin to be discontinued.  However, insulin drip order still active.  Insulin - Basal: Please consider ordering Levemir 15 units Q24H (based on 77.9 kg x 0.2 units).  Insulin-Correction: Please consider changing CBGs and Novolog to 0-9 units Q4H.  NOTE: Patient admitted from SNF with AMS, hyperkalemia, and acute renal failure. Noted patient received treatment x2 on 08/27/18 for hyperkalemia with insulin and D50.  Fasting glucose 314 mg/dl this morning and noted to be 275 mg/dl at 78:2912:15 today.  Patient has an order for IV insulin but per communication with RN, IV insulin drip will be discontinued and IV fluids changed. Since patient is from a facility, anticipate patient has been receiving insulin as noted on home medication list. If IV insulin will not be used to improve glycemic control, recommend ordering low dose Levemir insulin and change CBG and Novolog frequency to Q4H since patient is NPO.  Thanks, Orlando PennerMarie Jacki Couse, RN, MSN, CDE Diabetes Coordinator Inpatient Diabetes Program [phone] (Team  Pager from 8am to 5pm)

## 2018-08-29 LAB — BASIC METABOLIC PANEL
Anion gap: 7 (ref 5–15)
Anion gap: 6 (ref 5–15)
Anion gap: 4 — ABNORMAL LOW (ref 5–15)
BUN: 58 mg/dL — ABNORMAL HIGH (ref 8–23)
BUN: 52 mg/dL — ABNORMAL HIGH (ref 8–23)
BUN: 45 mg/dL — ABNORMAL HIGH (ref 8–23)
CALCIUM: 7.3 mg/dL — AB (ref 8.9–10.3)
CO2: 32 mmol/L (ref 22–32)
CO2: 33 mmol/L — ABNORMAL HIGH (ref 22–32)
CO2: 33 mmol/L — ABNORMAL HIGH (ref 22–32)
Calcium: 7.5 mg/dL — ABNORMAL LOW (ref 8.9–10.3)
Calcium: 7.5 mg/dL — ABNORMAL LOW (ref 8.9–10.3)
Chloride: 111 mmol/L (ref 98–111)
Chloride: 111 mmol/L (ref 98–111)
Chloride: 113 mmol/L — ABNORMAL HIGH (ref 98–111)
Creatinine, Ser: 2.77 mg/dL — ABNORMAL HIGH (ref 0.61–1.24)
Creatinine, Ser: 2.62 mg/dL — ABNORMAL HIGH (ref 0.61–1.24)
Creatinine, Ser: 2.14 mg/dL — ABNORMAL HIGH (ref 0.61–1.24)
GFR calc Af Amer: 27 mL/min — ABNORMAL LOW (ref >60)
GFR calc Af Amer: 28 mL/min — ABNORMAL LOW (ref >60)
GFR calc Af Amer: 36 mL/min — ABNORMAL LOW (ref >60)
GFR calc non Af Amer: 23 mL/min — ABNORMAL LOW (ref >60)
GFR calc non Af Amer: 25 mL/min — ABNORMAL LOW (ref >60)
GFR calc non Af Amer: 31 mL/min — ABNORMAL LOW (ref >60)
Glucose, Bld: 242 mg/dL — ABNORMAL HIGH (ref 70–99)
Glucose, Bld: 193 mg/dL — ABNORMAL HIGH (ref 70–99)
Glucose, Bld: 161 mg/dL — ABNORMAL HIGH (ref 70–99)
Potassium: 3.6 mmol/L (ref 3.5–5.1)
Potassium: 3.5 mmol/L (ref 3.5–5.1)
Potassium: 3.3 mmol/L — ABNORMAL LOW (ref 3.5–5.1)
Sodium: 150 mmol/L — ABNORMAL HIGH (ref 135–145)
Sodium: 150 mmol/L — ABNORMAL HIGH (ref 135–145)
Sodium: 150 mmol/L — ABNORMAL HIGH (ref 135–145)

## 2018-08-29 LAB — URINE CULTURE

## 2018-08-29 LAB — GLUCOSE, CAPILLARY
GLUCOSE-CAPILLARY: 203 mg/dL — AB (ref 70–99)
Glucose-Capillary: 113 mg/dL — ABNORMAL HIGH (ref 70–99)
Glucose-Capillary: 153 mg/dL — ABNORMAL HIGH (ref 70–99)
Glucose-Capillary: 168 mg/dL — ABNORMAL HIGH (ref 70–99)
Glucose-Capillary: 225 mg/dL — ABNORMAL HIGH (ref 70–99)
Glucose-Capillary: 273 mg/dL — ABNORMAL HIGH (ref 70–99)

## 2018-08-29 LAB — CBC
HCT: 41.6 % (ref 39.0–52.0)
Hemoglobin: 12.9 g/dL — ABNORMAL LOW (ref 13.0–17.0)
MCH: 28.9 pg (ref 26.0–34.0)
MCHC: 31 g/dL (ref 30.0–36.0)
MCV: 93.1 fL (ref 80.0–100.0)
PLATELETS: 145 10*3/uL — AB (ref 150–400)
RBC: 4.47 MIL/uL (ref 4.22–5.81)
RDW: 15.3 % (ref 11.5–15.5)
WBC: 5.5 10*3/uL (ref 4.0–10.5)
nRBC: 0 % (ref 0.0–0.2)

## 2018-08-29 LAB — HIV ANTIBODY (ROUTINE TESTING W REFLEX): HIV Screen 4th Generation wRfx: NONREACTIVE

## 2018-08-29 LAB — PROCALCITONIN: Procalcitonin: 0.33 ng/mL

## 2018-08-29 MED ORDER — LACTATED RINGERS IV SOLN
INTRAVENOUS | Status: DC
  Administered 2018-08-29: 04:00:00 via INTRAVENOUS

## 2018-08-29 MED ORDER — INSULIN ASPART 100 UNIT/ML ~~LOC~~ SOLN
0.0000 [IU] | Freq: Three times a day (TID) | SUBCUTANEOUS | Status: DC
  Administered 2018-08-29: 17:00:00 3 [IU] via SUBCUTANEOUS
  Administered 2018-08-30: 17:00:00 2 [IU] via SUBCUTANEOUS
  Administered 2018-08-30: 08:00:00 3 [IU] via SUBCUTANEOUS
  Administered 2018-08-30 – 2018-08-31 (×2): 2 [IU] via SUBCUTANEOUS
  Filled 2018-08-29 (×5): qty 1

## 2018-08-29 MED ORDER — SODIUM BICARBONATE 8.4 % IV SOLN: INTRAVENOUS | Status: DC

## 2018-08-29 MED ORDER — STERILE WATER FOR INJECTION IV SOLN
INTRAVENOUS | Status: DC
  Administered 2018-08-29 – 2018-08-31 (×5): via INTRAVENOUS
  Filled 2018-08-29 (×7): qty 850

## 2018-08-29 MED ORDER — VANCOMYCIN HCL IN DEXTROSE 1-5 GM/200ML-% IV SOLN
1000.0000 mg | INTRAVENOUS | Status: DC
  Administered 2018-08-29 – 2018-08-30 (×2): 1000 mg via INTRAVENOUS
  Filled 2018-08-29 (×2): qty 200

## 2018-08-29 MED ORDER — INSULIN ASPART 100 UNIT/ML ~~LOC~~ SOLN: 0.0000 [IU] | Freq: Every day | SUBCUTANEOUS | Status: DC

## 2018-08-29 NOTE — [place] FL2 (Signed)
[place] MEDICAID FL2 LEVEL OF CARE SCREENING TOOL     IDENTIFICATION  Patient Name: Alan Newman Birthdate: 10/24/1950 Sex: male Admission Date (Current Location): 08/27/2018  [place]   Facility and Address:  [facility], [address], [place], [place] [address]      Provider Number: [id number]  Attending Physician Name and Address:  Shaune Pollackhen, Qing, MD  Relative Name and Phone Number:       Current Level of Care: Hospital Recommended Level of Care: Skilled Nursing Facility Prior Approval Number:    Date Approved/Denied:   PASRR Number: [id number]  Discharge Plan:      Current Diagnoses: Patient Active Problem List   Diagnosis Date Noted  . Acute renal failure (ARF) (HCC) 08/27/2018    Orientation RESPIRATION BLADDER Height & Weight     Self, Place  Normal Continent Weight: 171 lb 11.8 oz (77.9 kg) Height:  6' (182.9 cm)  BEHAVIORAL SYMPTOMS/MOOD NEUROLOGICAL BOWEL NUTRITION STATUS  (none)   Continent Diet(dysphagia I)  AMBULATORY STATUS COMMUNICATION OF NEEDS Skin   Extensive Assist Verbally Normal                       Personal Care Assistance Level of Assistance  Bathing, Feeding, Dressing Bathing Assistance: Limited assistance Feeding assistance: Limited assistance Dressing Assistance: Limited assistance     Functional Limitations Info  (none)          SPECIAL CARE FACTORS FREQUENCY  PT (By licensed PT)                    Contractures Contractures Info: Not present    Additional Factors Info  Code Status Code Status Info: full             Current Medications (08/29/2018):  This is the current hospital active medication list Current Facility-Administered Medications  Medication Dose Route Frequency Provider Last Rate Last Dose  . acetaminophen (TYLENOL) tablet 650 mg  650 mg Oral Q6H PRN [place] SirenSainani, Vivek J, MD       Or  . acetaminophen (TYLENOL) suppository 650 mg   650 mg Rectal Q6H PRN [place] SirenSainani, Vivek J, MD      . Chlorhexidine Gluconate Cloth 2 % PADS 6 each  6 each Topical Q0600 Conforti, John, DO   6 each at 08/28/18 [id number]  . haloperidol lactate (HALDOL) injection 2.5 mg  2.5 mg Intravenous Q6H PRN [place] SirenSainani, Vivek J, MD      . insulin aspart (novoLOG) injection 0-5 Units  0-5 Units Subcutaneous QHS Conforti, John, DO      . insulin aspart (novoLOG) injection 0-9 Units  0-9 Units Subcutaneous TID WC Conforti, John, DO      . insulin detemir (LEVEMIR) injection 15 Units  15 Units Subcutaneous Daily Eugenie NorrieBlakeney, Dana G, NP   15 Units at 08/29/18 0930  . mupirocin ointment (BACTROBAN) 2 % 1 application  1 application Nasal BID Conforti, John, DO   1 application at 08/29/18 0932  . ondansetron (ZOFRAN) tablet 4 mg  4 mg Oral Q6H PRN [place] SirenSainani, Vivek J, MD       Or  . ondansetron (ZOFRAN) injection 4 mg  4 mg Intravenous Q6H PRN [place] SirenSainani, Vivek J, MD   4 mg at 08/29/18 1128  . pantoprazole (PROTONIX) injection 40 mg  40 mg Intravenous Q12H Houston SirenSainani, Vivek J, MD   40 mg at 08/29/18 0930  . sodium bicarbonate 50 mEq in sterile water 900  mL infusion   Intravenous Continuous Cherylann Ratel, [place], MD 75 mL/hr at 08/29/18 1006    . tamsulosin (FLOMAX) capsule 0.4 mg  0.4 mg Oral QHS Sainani, Rolly Pancake, MD      . vancomycin (VANCOCIN) IVPB 1000 mg/200 mL premix  1,000 mg Intravenous Q24H Foye Deer, RPH 200 mL/hr at 08/29/18 1514 1,000 mg at 08/29/18 1514     Discharge Medications: Please see discharge summary for a list of discharge medications.  Relevant Imaging Results:  Relevant Lab Results:   Additional Information ss: [id number]  York Spaniel, LCSW
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
## 2018-08-29 NOTE — Progress Notes (Addendum)
Patient asleep.  Very drowsy this shift. Precedex decreased.  Responsive. No discomfort or distress noted. Report given to [place], RN at this time.

## 2018-08-29 NOTE — Progress Notes (Signed)
OT Cancellation Note  Patient Details Name: Felecia ShellingWilliam L Orantes MRN: [id number] DOB: 05/04/1951   Cancelled Treatment:    Reason Eval/Treat Not Completed: Patient declined, no reason specified. Order received, chart reviewed. Upon attempt, pt just arrived to new Rm from ICU, with RN. Pt reporting significant nausea, declines OT at this time. Will re-attempt at later date/time as pt is medically appropriate and able to participate.   Richrd PrimeJamie Stiller, MPH, MS, OTR/L ascom [phone] 08/29/18, 2:50 PM
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
## 2018-08-29 NOTE — Progress Notes (Signed)
Inpatient Diabetes Program Recommendations  AACE/ADA: New Consensus Statement on Inpatient Glycemic Control (2015)  Target Ranges:  Prepandial:   less than 140 mg/dL      Peak postprandial:   less than 180 mg/dL (1-2 hours)      Critically ill patients:  140 - 180 mg/dL  Results for Alan ShellingMERRITT, Dereck L (MRN [id number]) as of 08/29/2018 10:12  Ref. Range 08/28/2018 07:34 08/28/2018 12:15 08/28/2018 16:39 08/28/2018 19:48 08/29/2018 00:33 08/29/2018 03:40 08/29/2018 07:35  Glucose-Capillary Latest Ref Range: 70 - 99 mg/dL 409314 (H)  Novolog 7 units 275 (H)  Novolog 5 units 253 (H)  Novolog 5 units  Levemir 15 units @ 16:46 225 (H)  Novolog 3 units 225 (H)  Novolog 3 units 273 (H)  Novolog 5 units 113 (H)     Levemir 15 units @ 9:30    Review of Glycemic Control  Diabetes history: DM2 Outpatient Diabetes medications: NPH 24 units QAM, NPH 20 untis QHS, Humalog TID (based on correction scale), Metformin 1000 mg BID Current orders for Inpatient glycemic control: Levemir 15 units daily, Novolog 0-9 units Q4H  Inpatient Diabetes Program Recommendations:  Insulin - Basal: If glucose continues to be consistently over 180 mg/dl, please consider increasing Levemir to 22 units daily.  Thanks, Orlando PennerMarie Arne Schlender, RN, MSN, CDE Diabetes Coordinator Inpatient Diabetes Program [phone] (Team Pager from 8am to 5pm)
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
## 2018-08-29 NOTE — Consult Note (Signed)
Pharmacy Antibiotic Note  Felecia ShellingWilliam L Kertesz is a 67 y.o. male admitted on 08/27/2018 with sepsis. Pharmacy has been consulted for Vancomycin/Cefepime dosing.   Renal function is improving, need to recalculate Vancmycin dose.  Ke=0.036  t1/2=19hr  Vd-54.5 L  Patient with MRSE in Urine culture.  Plan: Discontinued Cefepime during CCU rounds 12/6. Will increase Vancomycin to 1g IV q24h.  Continue to watch for improvement in renal function.   Height: 6' (182.9 cm) Weight: 171 lb 11.8 oz (77.9 kg) IBW/kg (Calculated) : 77.6  Temp (24hrs), Avg:98 F (36.7 C), Min:97.8 F (36.6 C), Max:98.5 F (36.9 C)  Recent Labs  Lab 08/27/18 1323 08/27/18 1329  08/27/18 2313  08/28/18 0211 08/28/18 0528 08/28/18 0836 08/28/18 1341 08/29/18 0130 08/29/18 0539 08/29/18 1049  WBC 10.4  --   --   --   --   --  6.9  --   --   --  5.5  --   CREATININE 6.38*  --    < >  --    < >  --  4.79* 4.22* 3.82* 2.77* 2.62* 2.14*  LATICACIDVEN  --  7.33*  --  3.9*  --  2.1*  --   --   --   --   --   --    < > = values in this interval not displayed.    Estimated Creatinine Clearance: 37.8 mL/min (A) (by C-G formula based on SCr of 2.14 mg/dL (H)).    No Known Allergies  Antimicrobials this admission: Zosyn 12/04 >> 12/04 Vancomycin 12/04 >>  Cefepime 12/04 >> 12/6   Dose adjustments this admission: 12/6 increased Vancomyin 1g IV q48h to q24h  Microbiology results: 12/04 BCx: NGTD 12/04 UCx: >100,000 MRSE  Thank you for allowing pharmacy to be a part of this patient's care.  Clovia CuffLisa Jessah Danser, PharmD, BCPS 08/29/2018 1:36 PM
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
## 2018-08-29 NOTE — Progress Notes (Signed)
Message left for pt's spouse [name] re he is moving to room 112.  Report has been called, pt's VSS on room air, he is transferring to off unit tele, chart meds and belongings will transfer with him
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
## 2018-08-30 ENCOUNTER — Inpatient Hospital Stay: Payer: Medicare Other

## 2018-08-30 DIAGNOSIS — E43 Unspecified severe protein-calorie malnutrition: Secondary | ICD-10-CM

## 2018-08-30 DIAGNOSIS — R112 Nausea with vomiting, unspecified: Secondary | ICD-10-CM

## 2018-08-30 DIAGNOSIS — F0631 Mood disorder due to known physiological condition with depressive features: Secondary | ICD-10-CM

## 2018-08-30 DIAGNOSIS — F05 Delirium due to known physiological condition: Secondary | ICD-10-CM

## 2018-08-30 LAB — BASIC METABOLIC PANEL
Anion gap: 13 (ref 5–15)
BUN: 36 mg/dL — ABNORMAL HIGH (ref 8–23)
CO2: 27 mmol/L (ref 22–32)
Calcium: 7.5 mg/dL — ABNORMAL LOW (ref 8.9–10.3)
Chloride: 108 mmol/L (ref 98–111)
Creatinine, Ser: 1.49 mg/dL — ABNORMAL HIGH (ref 0.61–1.24)
GFR calc Af Amer: 56 mL/min — ABNORMAL LOW (ref >60)
GFR calc non Af Amer: 49 mL/min — ABNORMAL LOW (ref >60)
Glucose, Bld: 224 mg/dL — ABNORMAL HIGH (ref 70–99)
Potassium: 3.2 mmol/L — ABNORMAL LOW (ref 3.5–5.1)
Sodium: 148 mmol/L — ABNORMAL HIGH (ref 135–145)

## 2018-08-30 LAB — GLUCOSE, CAPILLARY
GLUCOSE-CAPILLARY: 197 mg/dL — AB (ref 70–99)
Glucose-Capillary: 214 mg/dL — ABNORMAL HIGH (ref 70–99)
Glucose-Capillary: 191 mg/dL — ABNORMAL HIGH (ref 70–99)
Glucose-Capillary: 172 mg/dL — ABNORMAL HIGH (ref 70–99)

## 2018-08-30 MED ORDER — POTASSIUM CHLORIDE 10 MEQ/100ML IV SOLN
10.0000 meq | INTRAVENOUS | Status: AC
  Administered 2018-08-30 (×2): 10 meq via INTRAVENOUS
  Filled 2018-08-30 (×2): qty 100

## 2018-08-30 MED ORDER — LORAZEPAM 2 MG/ML IJ SOLN
1.0000 mg | INTRAMUSCULAR | Status: DC | PRN
  Administered 2018-08-31: 1 mg via INTRAVENOUS
  Filled 2018-08-30: qty 1

## 2018-08-30 MED ORDER — LEVETIRACETAM IN NACL 500 MG/100ML IV SOLN
500.0000 mg | Freq: Two times a day (BID) | INTRAVENOUS | Status: DC
  Filled 2018-08-30 (×2): qty 100

## 2018-08-30 MED ORDER — OLANZAPINE 5 MG PO TBDP
2.5000 mg | ORAL_TABLET | Freq: Four times a day (QID) | ORAL | Status: DC | PRN
  Filled 2018-08-30: qty 0.5

## 2018-08-30 MED ORDER — LEVETIRACETAM IN NACL 1000 MG/100ML IV SOLN
1000.0000 mg | Freq: Once | INTRAVENOUS | Status: AC
  Administered 2018-08-31: 1000 mg via INTRAVENOUS
  Filled 2018-08-30: qty 100

## 2018-08-30 MED ORDER — SODIUM CHLORIDE 0.9% FLUSH
3.0000 mL | INTRAVENOUS | Status: DC | PRN
  Administered 2018-08-30 – 2018-08-31 (×2): 3 mL via INTRAVENOUS
  Filled 2018-08-30 (×2): qty 3

## 2018-08-30 MED ORDER — VANCOMYCIN HCL IN DEXTROSE 1-5 GM/200ML-% IV SOLN
1000.0000 mg | INTRAVENOUS | Status: DC
  Administered 2018-08-31: 10:00:00 1000 mg via INTRAVENOUS
  Filled 2018-08-30: qty 200

## 2018-08-30 MED ORDER — METOPROLOL TARTRATE 5 MG/5ML IV SOLN
5.0000 mg | Freq: Four times a day (QID) | INTRAVENOUS | Status: DC | PRN
  Administered 2018-08-30 – 2018-08-31 (×3): 5 mg via INTRAVENOUS
  Filled 2018-08-30 (×3): qty 5

## 2018-08-30 MED ORDER — METOCLOPRAMIDE HCL 5 MG/ML IJ SOLN
5.0000 mg | Freq: Four times a day (QID) | INTRAMUSCULAR | Status: DC
  Administered 2018-08-30 – 2018-08-31 (×4): 5 mg via INTRAVENOUS
  Filled 2018-08-30 (×4): qty 2

## 2018-08-30 NOTE — Progress Notes (Signed)
OT Cancellation Note  Patient Details Name: Alan Newman MRN: [id number] DOB: 01/05/1951   Cancelled Treatment:    Reason Eval/Treat Not Completed: Medical issues which prohibited therapy Pt had just attempted to work with PT and was actively vomiting when OT presented for Evaluation. RN requests that OT hold off at this time.   Darl Householderlison M Jyden Kromer 08/30/2018, 10:15 AM

## 2018-08-30 NOTE — Consult Note (Addendum)
Pharmacy Antibiotic Note  Alan Newman is a 67 y.o. male admitted on 08/27/2018 with sepsis. Pharmacy has been consulted for Vancomycin/Cefepime dosing.   Discontinued Cefepime during CCU rounds 12/6.  12/6: Renal function is improving, need to recalculate Vancomycin dose.  Ke=0.036  t1/2=19hr  Vd-54.5 L Will increase Vancomycin to 1g IV q24h.  Patient with MRSE in Urine culture.  Plan: 12/7: renal function continues to improve Ke 0.049, half life 14.2, Vd 54.5  Will increase to vancomycin 1000 mg IV q18h, trough before 4th dose of regimen  Continue to watch for improvement in renal function.   Height: 6' (182.9 cm) Weight: 171 lb 11.8 oz (77.9 kg) IBW/kg (Calculated) : 77.6  Temp (24hrs), Avg:98.6 F (37 C), Min:97.8 F (36.6 C), Max:99.4 F (37.4 C)  Recent Labs  Lab 08/27/18 1323 08/27/18 1329  08/27/18 2313  08/28/18 0211 08/28/18 0528  08/28/18 1341 08/29/18 0130 08/29/18 0539 08/29/18 1049 08/30/18 0820  WBC 10.4  --   --   --   --   --  6.9  --   --   --  5.5  --   --   CREATININE 6.38*  --    < >  --    < >  --  4.79*   < > 3.82* 2.77* 2.62* 2.14* 1.49*  LATICACIDVEN  --  7.33*  --  3.9*  --  2.1*  --   --   --   --   --   --   --    < > = values in this interval not displayed.    Estimated Creatinine Clearance: 54.3 mL/min (A) (by C-G formula based on SCr of 1.49 mg/dL (H)).    No Known Allergies  Antimicrobials this admission: Zosyn 12/04 >> 12/04 Vancomycin 12/04 >>  Cefepime 12/04 >> 12/6   Dose adjustments this admission: 12/6 increased Vancomyin 1g IV q48h to q24h  Microbiology results: 12/04 BCx: NGTD 12/04 UCx: >100,000 MRSE  Thank you for allowing pharmacy to be a part of this patient's care.  Crist FatHannah Lyndzee Kliebert, PharmD, BCPS Clinical Pharmacist 08/30/2018 4:55 PM

## 2018-08-30 NOTE — Clinical Social Work Note (Signed)
CSW is aware that patient has been screened out for CIR. CSW will assess for SNF placement when able.  Argentina PonderKaren Martha Desirea Mizrahi, MSW, Theresia MajorsLCSWA [phone]

## 2018-08-30 NOTE — Progress Notes (Signed)
Dr Hilton SinclairWeiting in pts room rounding, made him aware that everytime pt is moved he becomes nauseous and sometimes vomits small amount, pt not wanting to eat appetite poor
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
## 2018-08-30 NOTE — Progress Notes (Signed)
Dr Renae GlossWieting aware of pts BP after metoprolol and that MRI will be sending for pt soon

## 2018-08-30 NOTE — Progress Notes (Signed)
No changes in condition at this time

## 2018-08-30 NOTE — Plan of Care (Signed)
  Problem: Clinical Measurements: Goal: Ability to maintain clinical measurements within normal limits will improve Outcome: Progressing Goal: Will remain free from infection Outcome: Progressing Goal: Diagnostic test results will improve Outcome: Progressing Goal: Respiratory complications will improve Outcome: Progressing Goal: Cardiovascular complication will be avoided Outcome: Progressing   Problem: Nutrition: Goal: Adequate nutrition will be maintained Outcome: Progressing   Problem: Coping: Goal: Level of anxiety will decrease Outcome: Progressing   Problem: Elimination: Goal: Will not experience complications related to bowel motility Outcome: Progressing Goal: Will not experience complications related to urinary retention Outcome: Progressing   Problem: Pain Managment: Goal: General experience of comfort will improve Outcome: Progressing   Problem: Safety: Goal: Ability to remain free from injury will improve Outcome: Progressing   Problem: Skin Integrity: Goal: Risk for impaired skin integrity will decrease Outcome: Progressing   Problem: Clinical Measurements: Goal: Complications related to the disease process or treatment will be avoided or minimized Outcome: Progressing   Problem: Activity: Goal: Activity intolerance will improve Outcome: Progressing   Problem: Fluid Volume: Goal: Fluid volume balance will be maintained or improved Outcome: Progressing   Problem: Respiratory: Goal: Respiratory symptoms related to disease process will be avoided Outcome: Progressing   Problem: Self-Concept: Goal: Body image disturbance will be avoided or minimized Outcome: Progressing   Problem: Urinary Elimination: Goal: Progression of disease will be identified and treated Outcome: Progressing

## 2018-08-30 NOTE — Progress Notes (Signed)
Pt has onset nausea/vomiting on beginning of transport in bed to MRI in hallway- triggered by movement considered. Pt turned on right side with clear beige liquid with few blood streaks approximately 100 ml. Transported took pt to MRI. Zofran IV given in MRI prior to scan.
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
## 2018-08-30 NOTE — Progress Notes (Signed)
Report given to Erskine SquibbJane RN, updated on pts current status, pt resting comfortable awaiting transport to MRI, no complaints

## 2018-08-30 NOTE — Progress Notes (Addendum)
Pt previously was alert and able to answer yes no questions. Per Nurse Tech pt had an episode of possible seizure activity when changing him. Nurse Tech reports pt unable to respond, eyes fluttering and rolled back, as well as a decrease in O2 saturation. Nurse was alerted immediately and witnessed O2 saturation at 89%, pt was able to follow simple commands, and O2 returned shortly to 98% on room air. MD paged.  Pt found by RN having seizure activity Approximately 90 seconds, eyes rolled in the back of his head and lids fluttering, Right leg twitching, Mouth and right arm twitching, Pt not alert or able to respond at that time. MD paged, O2 applied, New orders given. Pt currently stable, VS stable. And very sleepy, Pt able to open eyes on command. MD instructed RN to admin Hydralazine and Ativan now. Rails padded, suction set up, O2 set up but removed from pt due to normal Saturations. Will continue to monitor.

## 2018-08-30 NOTE — Progress Notes (Signed)
Contacted MRI with pt on schedule; Dr. Renae GlossWieting aware that MRI is still pending.

## 2018-08-30 NOTE — Progress Notes (Signed)
Dr Renae GlossWieting made aware that pts BP elevated
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
## 2018-08-30 NOTE — Progress Notes (Signed)
Patient ID: Alan Newman, male   DOB: 09/20/1951, 67 y.o.   MRN: [id number]  [employer] [facility] PROGRESS NOTE  Alan Newman:[id number] DOB: December 30, 1950 DOA: 08/27/2018 PCP: Patient, No Pcp Per  HPI/Subjective: Patient only answers a few yes or no questions.  As per nurse every time they move him he gets nauseous and vomits.  Spoke with patient's wife and he had a long hospital course and was over at the rehab and lost 50 pounds at the rehab.  As per nursing staff not able to eat.  Objective: Vitals:   08/29/18 1923 08/30/18 0434  BP: 128/86 131/89  Pulse: (!) 102 (!) 112  Resp: 12 12  Temp: 98.7 F (37.1 C) 99.4 F (37.4 C)  SpO2: 92% 94%    Intake/Output Summary (Last 24 hours) at 08/30/2018 1106 Last data filed at 08/29/2018 2055 Gross per 24 hour  Intake 426.48 ml  Output 400 ml  Net 26.48 ml   Filed Weights   08/27/18 1318 08/27/18 2100  Weight: 76.8 kg 77.9 kg    ROS: Review of Systems  Respiratory: Negative for shortness of breath.   Cardiovascular: Negative for chest pain.  Gastrointestinal: Positive for nausea and vomiting. Negative for abdominal pain.   Exam: Physical Exam  Constitutional: He appears lethargic.  HENT:  Nose: No mucosal edema.  Mouth/Throat: No oropharyngeal exudate.  Eyes: Pupils are equal, round, and reactive to light. Conjunctivae and lids are normal.  Cardiovascular: Regular rhythm, S1 normal, S2 normal and normal heart sounds.  Respiratory: He has no decreased breath sounds. He has no wheezes. He has rhonchi in the right lower field and the left lower field. He has no rales.  GI: Soft. Bowel sounds are normal. There is no tenderness.  Musculoskeletal:       Right ankle: He exhibits swelling.       Left ankle: He exhibits swelling.  Lymphadenopathy:    He has no cervical adenopathy.  Neurological: He appears lethargic.  Able to flex and extend at ankles.  Unable to straight leg raise.  Unable to move left arm.  Barely able to  squeeze with right hand.  Skin: Skin is warm. No rash noted.  Left back near the spine a small little opening.  No discharge from the opening.  Surrounding skin darkness.  Psychiatric: His affect is blunt.      Data Reviewed: Basic Metabolic Panel: Recent Labs  Lab 08/28/18 0049  08/28/18 1341 08/29/18 0130 08/29/18 0539 08/29/18 1049 08/30/18 0820  NA 152*   < > 149* 150* 150* 150* 148*  K 4.9   < > 4.4 3.6 3.5 3.3* 3.2*  CL 113*   < > 112* 111 111 113* 108  CO2 19*   < > 23 32 33* 33* 27  GLUCOSE 157*   < > 318* 242* 193* 161* 224*  BUN 85*   < > 72* 58* 52* 45* 36*  CREATININE 5.21*   < > 3.82* 2.77* 2.62* 2.14* 1.49*  CALCIUM 7.7*   < > 7.5* 7.5* 7.5* 7.3* 7.5*  MG 1.5*  --   --   --   --   --   --    < > = values in this interval not displayed.   Liver Function Tests: Recent Labs  Lab 08/27/18 1323  AST 25  ALT 19  ALKPHOS 128*  BILITOT 0.8  PROT 7.1  ALBUMIN 3.8   CBC: Recent Labs  Lab 08/27/18 1323  08/28/18 0528 08/28/18  [id number] 08/28/18 1341 08/28/18 2144 08/29/18 0539  WBC 10.4  --  6.9  --   --   --  5.5  NEUTROABS 8.9*  --   --   --   --   --   --   HGB 16.3   < > 12.6* 11.8* 13.1 12.8* 12.9*  HCT 56.0*   < > 41.7 38.8* 42.9 40.6 41.6  MCV 97.6  --  95.2  --   --   --  93.1  PLT 298  --  164  --   --   --  145*   < > = values in this interval not displayed.   Cardiac Enzymes: Recent Labs  Lab 08/27/18 1323  TROPONINI <0.03    CBG: Recent Labs  Lab 08/29/18 0735 08/29/18 1233 08/29/18 1639 08/29/18 2052 08/30/18 0800  GLUCAP 113* 168* 203* 153* 214*    Recent Results (from the past 240 hour(s))  Blood Culture (routine x 2)     Status: None (Preliminary result)   Collection Time: 08/27/18  1:23 PM  Result Value Ref Range Status   Specimen Description BLOOD RIGHT AC  Final   Special Requests   Final    BOTTLES DRAWN AEROBIC AND ANAEROBIC Blood Culture adequate volume   Culture   Final    NO GROWTH 3 DAYS Performed at [facility], [address] [place], [place] [address]    Report Status PENDING  Incomplete  Urine culture     Status: Abnormal   Collection Time: 08/27/18  1:23 PM  Result Value Ref Range Status   Specimen Description   Final    URINE, RANDOM Performed at [facility], [address] [place], [place] [address]    Special Requests   Final    NONE Performed at [facility], [address] [place], [place] [address]    Culture >=100,000 COLONIES/mL STAPHYLOCOCCUS EPIDERMIDIS (A)  Final   Report Status 08/29/2018 FINAL  Final   Organism ID, Bacteria STAPHYLOCOCCUS EPIDERMIDIS (A)  Final      Susceptibility   Staphylococcus epidermidis - MIC*    CIPROFLOXACIN >=8 RESISTANT Resistant     GENTAMICIN 8 INTERMEDIATE Intermediate     NITROFURANTOIN <=16 SENSITIVE Sensitive     OXACILLIN >=4 RESISTANT Resistant     TETRACYCLINE 2 SENSITIVE Sensitive     VANCOMYCIN 1 SENSITIVE Sensitive     TRIMETH/SULFA 160 RESISTANT Resistant     CLINDAMYCIN >=8 RESISTANT Resistant     RIFAMPIN <=0.5 SENSITIVE Sensitive     Inducible Clindamycin NEGATIVE Sensitive     * >=100,000 COLONIES/mL STAPHYLOCOCCUS EPIDERMIDIS  Blood Culture (routine x 2)     Status: None (Preliminary result)   Collection Time: 08/27/18  1:33 PM  Result Value Ref Range Status   Specimen Description BLOOD RIGHT ANTECUBITAL  Final   Special Requests   Final    BOTTLES DRAWN AEROBIC AND ANAEROBIC Blood Culture results may not be optimal due to an excessive volume of blood received in culture bottles   Culture   Final    NO GROWTH 3 DAYS Performed at [facility], [address] [place], [place] [address]    Report Status PENDING  Incomplete  MRSA PCR Screening     Status: Abnormal   Collection Time: 08/27/18  8:38 PM  Result Value Ref Range Status   MRSA by PCR POSITIVE (A) NEGATIVE Final    Comment:        The  GeneXpert MRSA Assay (FDA approved for NASAL specimens only), is one  component of a comprehensive MRSA colonization surveillance program. It is not intended to diagnose MRSA infection nor to guide or monitor treatment for MRSA infections. RESULT CALLED TO, READ BACK BY AND VERIFIED WITH: KELSEY WATTS @2214  08/27/18 AKT Performed at [facility], [address] [place], [place] [address]      Scheduled Meds: . Chlorhexidine Gluconate Cloth  6 each Topical Q0600  . insulin aspart  0-5 Units Subcutaneous QHS  . insulin aspart  0-9 Units Subcutaneous TID WC  . insulin detemir  15 Units Subcutaneous Daily  . metoCLOPramide (REGLAN) injection  5 mg Intravenous Q6H  . mupirocin ointment  1 application Nasal BID  . pantoprazole (PROTONIX) IV  40 mg Intravenous Q12H  . tamsulosin  0.4 mg Oral QHS   Continuous Infusions: . potassium chloride    .  sodium bicarbonate (isotonic) infusion in sterile water 75 mL/hr at 08/30/18 0036  . vancomycin Stopped (08/29/18 1614)    Assessment/Plan:  1. Acute kidney injury.  Creatinine has improved from 6.3 down to 1.49.  Currently on bicarb drip. 2. Acute encephalopathy.  With the patient's nauseousness and vomiting with little movement will get an MRI of the brain to rule out stroke bleed. 3. Hyperkalemia initially now with hypokalemia.  We will give 2 potassium runs. 4. Hypernatremia secondary to poor oral intake on bicarb drip. 5. Hypotension on presentation.  Blood pressure stable.  Continue to hold antihypertensive medications. 6. Type 2 diabetes mellitus on detemir insulin and sliding scale 7. Nausea vomiting which was persistent and worsens with any movement.  Will get GI consultation.  Put on empiric Reglan.  Patient already on Protonix.  Rule out central causes also. 8. Diabetic neuropathy holding gabapentin 9. Patient's wife requested a psychiatric consultation and we will get. 10. Staph epidermidis in the urine culture on vancomycin.  So far blood cultures are negative.  Empirically on cefepime.   Unsure what I am treating.  Code Status:     Code Status Orders  (From admission, onward)         Start     Ordered   08/27/18 1533  Full code  Continuous     08/27/18 1533        Code Status History    This patient has a current code status but no historical code status.     Family Communication: Spoke with wife on the phone Disposition Plan: To be determined  Consultants:  Nephrology  Gastroenterology  Psychiatry  Antibiotics:  Cefepime  Vancomycin  Time spent: 30 minutes    Donyea Gafford [employer]

## 2018-08-30 NOTE — Consult Note (Signed)
ORTHOPAEDIC CONSULTATION  REQUESTING PHYSICIAN: Alford Highland, MD  Chief Complaint: emesis  HPI: Alan Newman is a 67 y.o. male who is admitted from rehab after coffee ground emesis. Please see H&P and ED notes for details. Denies any numbness, tingling or constitutional symptoms. A left clavicle fracture was noted on a chest x-ray and consult placed.  Past Medical History:  Diagnosis Date  . Diabetes mellitus without complication (HCC)   . Hypertension   . Stroke [facility])    Past Surgical History:  Procedure Laterality Date  . BRAIN SURGERY    . CARDIAC SURGERY     Social History   Socioeconomic History  . Marital status: Married    Spouse name: Not on file  . Number of children: Not on file  . Years of education: Not on file  . Highest education level: Not on file  Occupational History  . Not on file  Social Needs  . Financial resource strain: Not on file  . Food insecurity:    Worry: Not on file    Inability: Not on file  . Transportation needs:    Medical: Not on file    Non-medical: Not on file  Tobacco Use  . Smoking status: Never Smoker  . Smokeless tobacco: Never Used  Substance and Sexual Activity  . Alcohol use: Never    Frequency: Never  . Drug use: Never  . Sexual activity: Not on file  Lifestyle  . Physical activity:    Days per week: Not on file    Minutes per session: Not on file  . Stress: Not on file  Relationships  . Social connections:    Talks on phone: Not on file    Gets together: Not on file    Attends religious service: Not on file    Active member of club or organization: Not on file    Attends meetings of clubs or organizations: Not on file    Relationship status: Not on file  Other Topics Concern  . Not on file  Social History Narrative  . Not on file   Family History  Problem Relation Age of Onset  . Kidney disease Mother   . Dementia Father   . Cancer Father   . Stomach cancer Sister    No Known  Allergies Prior to Admission medications   Medication Sig Start Date End Date Taking? Authorizing Provider  acetaminophen (TYLENOL) 500 MG tablet Take 1,000 mg by mouth every 8 (eight) hours as needed.   Yes [provider]  amLODipine (NORVASC) 10 MG tablet Take 10 mg by mouth daily.   Yes [provider]  aspirin 81 MG chewable tablet Chew 81 mg by mouth daily.   Yes [provider]  atorvastatin (LIPITOR) 80 MG tablet Take 80 mg by mouth at bedtime.   Yes [provider]  cetirizine (ZYRTEC) 10 MG tablet Take 10 mg by mouth daily.   Yes [provider]  ciprofloxacin (CIPRO) 500 MG tablet Take 500 mg by mouth 2 (two) times daily.   Yes [provider]  docusate sodium (COLACE) 100 MG capsule Take 100 mg by mouth daily.   Yes [provider]  ferrous gluconate (FERGON) 324 MG tablet Take 324 mg by mouth daily with breakfast.   Yes [provider]  gabapentin (NEURONTIN) 100 MG capsule Take 100 mg by mouth 3 (three) times daily.   Yes [provider]  insulin lispro (HUMALOG) 100 UNIT/ML injection Inject into the skin  3 (three) times daily before meals.   Yes [provider]  insulin NPH Human (HUMULIN N,NOVOLIN N) 100 UNIT/ML injection Inject 20 Units into the skin at bedtime.   Yes [provider]  insulin NPH Human (HUMULIN N,NOVOLIN N) 100 UNIT/ML injection Inject 24 Units into the skin daily before breakfast.   Yes [provider]  ipratropium (ATROVENT) 0.06 % nasal spray Place 2 sprays into both nostrils 3 (three) times daily as needed for rhinitis.   Yes [provider]  isosorbide mononitrate (IMDUR) 30 MG 24 hr tablet Take 30 mg by mouth daily.   Yes [provider]  lisinopril (PRINIVIL,ZESTRIL) 5 MG tablet Take 5 mg by mouth daily.   Yes [provider]  metFORMIN (GLUCOPHAGE) 1000 MG tablet Take 1,000 mg by mouth 2 (two) times daily with a meal.   Yes  [provider]  metoprolol tartrate (LOPRESSOR) 50 MG tablet Take 50 mg by mouth 2 (two) times daily.   Yes [provider]  Omega-3 Fatty Acids (FISH OIL) 1000 MG CAPS Take 1 capsule by mouth 2 (two) times daily.   Yes [provider]  ondansetron (ZOFRAN-ODT) 4 MG disintegrating tablet Take 4 mg by mouth 4 (four) times daily as needed for nausea or vomiting.   Yes [provider]  oxycodone (OXY-IR) 5 MG capsule Take 10 mg by mouth 2 (two) times daily.   Yes [provider]  pantoprazole (PROTONIX) 40 MG tablet Take 40 mg by mouth daily.   Yes [provider]  scopolamine (TRANSDERM-SCOP) 1 MG/3DAYS Place 1 patch onto the skin every 3 (three) days.   Yes [provider]  sertraline (ZOLOFT) 100 MG tablet Take 100 mg by mouth at bedtime.   Yes [provider]  tamsulosin (FLOMAX) 0.4 MG CAPS capsule Take 0.4 mg by mouth at bedtime.   Yes [provider]   No results found.  Positive ROS: All other systems have been reviewed and were otherwise negative with the exception of those mentioned in the HPI and as above.  Physical Exam: General: Alert, no acute distress Cardiovascular: No pedal edema Respiratory: No cyanosis, no use of accessory musculature GI: No organomegaly, abdomen is soft and non-tender Skin: No lesions in the area of chief complaint Neurologic: Sensation intact distally Psychiatric: Patient is competent for consent with normal mood and affect Lymphatic: No axillary or cervical lymphadenopathy  MUSCULOSKELETAL: no use of the left upper extremity. Compartments soft. Good cap refill. Increased lower extremity tone  Assessment:   ICD-10-CM   1. Acute renal failure, unspecified acute renal failure type (HCC) N17.9   2. Hyperkalemia E87.5   3. Lactic acidosis E87.2   4. Acute cystitis without hematuria N30.00   5. Acute respiratory failure (HCC) J96.00 DG Chest Port 1 View    DG Chest Port 1  View   Stroke with left sided weakness GI bleed   Plan: Patient with profound left upper extremity weakness after stroke. Clavicle fracture is healing without complication. There are no restrictions regarding his left clavicle fracture.  He may follow-up with his regular orthopedist. Please call with questions.    Lyndle HerrlichJames R Twala Collings, MD    08/30/2018 1:26 PM

## 2018-08-31 LAB — GLUCOSE, CAPILLARY
GLUCOSE-CAPILLARY: 177 mg/dL — AB (ref 70–99)
Glucose-Capillary: 178 mg/dL — ABNORMAL HIGH (ref 70–99)

## 2018-08-31 MED ORDER — STERILE WATER FOR INJECTION IV SOLN: 75.0000 mL/h | INTRAVENOUS | 0 refills | Status: DC

## 2018-08-31 MED ORDER — HYDRALAZINE HCL 20 MG/ML IJ SOLN
10.0000 mg | Freq: Four times a day (QID) | INTRAMUSCULAR | Status: DC | PRN
  Administered 2018-08-31: 06:00:00 10 mg via INTRAVENOUS
  Filled 2018-08-31 (×2): qty 1

## 2018-08-31 MED ORDER — SODIUM CHLORIDE 0.9 % IV SOLN: 50.0000 mL | INTRAVENOUS | 0 refills | Status: DC

## 2018-08-31 MED ORDER — PANTOPRAZOLE SODIUM 40 MG IV SOLR: 40.0000 mg | Freq: Two times a day (BID) | INTRAVENOUS | Status: DC

## 2018-08-31 MED ORDER — LORAZEPAM 2 MG/ML IJ SOLN: 1.0000 mg | INTRAMUSCULAR | 0 refills | Status: DC | PRN

## 2018-08-31 MED ORDER — HALOPERIDOL LACTATE 5 MG/ML IJ SOLN: 2.5000 mg | Freq: Four times a day (QID) | INTRAMUSCULAR | Status: DC | PRN

## 2018-08-31 MED ORDER — VANCOMYCIN HCL IN DEXTROSE 1-5 GM/200ML-% IV SOLN: 1000.0000 mg | INTRAVENOUS | Status: DC

## 2018-08-31 MED ORDER — INSULIN DETEMIR 100 UNIT/ML ~~LOC~~ SOLN: 6.0000 [IU] | Freq: Every day | SUBCUTANEOUS | 11 refills | Status: DC

## 2018-08-31 MED ORDER — HYDRALAZINE HCL 20 MG/ML IJ SOLN: 10.0000 mg | Freq: Four times a day (QID) | INTRAMUSCULAR | Status: DC | PRN

## 2018-08-31 MED ORDER — METOPROLOL TARTRATE 5 MG/5ML IV SOLN
2.5000 mg | Freq: Once | INTRAVENOUS | Status: AC
  Administered 2018-08-31: 2.5 mg via INTRAVENOUS
  Filled 2018-08-31: qty 5

## 2018-08-31 MED ORDER — INSULIN DETEMIR 100 UNIT/ML ~~LOC~~ SOLN
6.0000 [IU] | Freq: Every day | SUBCUTANEOUS | Status: DC
  Administered 2018-08-31: 6 [IU] via SUBCUTANEOUS
  Filled 2018-08-31: qty 0.06

## 2018-08-31 MED ORDER — INSULIN ASPART 100 UNIT/ML ~~LOC~~ SOLN: 0.0000 [IU] | Freq: Three times a day (TID) | SUBCUTANEOUS | 11 refills | Status: DC

## 2018-08-31 MED ORDER — LEVETIRACETAM IN NACL 500 MG/100ML IV SOLN: 500.0000 mg | Freq: Two times a day (BID) | INTRAVENOUS | Status: DC

## 2018-08-31 MED ORDER — SODIUM CHLORIDE 0.9 % IV SOLN
INTRAVENOUS | Status: DC
  Administered 2018-08-31: 10:00:00 via INTRAVENOUS

## 2018-08-31 MED ORDER — OLANZAPINE 5 MG PO TBDP: 2.5000 mg | ORAL_TABLET | Freq: Four times a day (QID) | ORAL | Status: DC | PRN

## 2018-08-31 MED ORDER — METOCLOPRAMIDE HCL 5 MG/ML IJ SOLN: 5.0000 mg | Freq: Four times a day (QID) | INTRAMUSCULAR | 0 refills | Status: DC

## 2018-08-31 MED ORDER — ONDANSETRON HCL 4 MG/2ML IJ SOLN: 4.0000 mg | Freq: Four times a day (QID) | INTRAMUSCULAR | 0 refills | Status: DC | PRN

## 2018-08-31 MED ORDER — METOPROLOL TARTRATE 5 MG/5ML IV SOLN: 5.0000 mg | Freq: Four times a day (QID) | INTRAVENOUS | Status: DC | PRN

## 2018-08-31 MED ORDER — INSULIN ASPART 100 UNIT/ML ~~LOC~~ SOLN: 0.0000 [IU] | Freq: Every day | SUBCUTANEOUS | 11 refills | Status: DC

## 2018-08-31 MED ORDER — MUPIROCIN 2 % EX OINT: 1.0000 "application " | TOPICAL_OINTMENT | Freq: Two times a day (BID) | CUTANEOUS | 0 refills | Status: DC

## 2018-08-31 MED ORDER — INSULIN DETEMIR 100 UNIT/ML ~~LOC~~ SOLN: 15.0000 [IU] | Freq: Every day | SUBCUTANEOUS | 11 refills | Status: DC

## 2018-08-31 NOTE — Progress Notes (Signed)
Pt has had no witnessed seizure activity since previous episodes. BP elevated, MD aware. Other VS Within normal range. Will continue to monitor.
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
## 2018-08-31 NOTE — Progress Notes (Signed)
This Clinical research associatewriter received a [facility]. Called report to Nolberto HanlonMarisa, Charity fundraiserN from Neuroscience [phone].  Marcelino DusterMichelle from [facility] called back and verbalized that pt needs a higher level of care, current bed assignment d/c. Awaiting on a bed.

## 2018-08-31 NOTE — Progress Notes (Signed)
MD aware of VS

## 2018-08-31 NOTE — Progress Notes (Signed)
MD notified of BP 157/94 after hypertension medications given. Pt stable and able to follow commands. Will continue to monitor.

## 2018-08-31 NOTE — Clinical Social Work Note (Signed)
The CSW received a consult for discharge needs/coordination of care. According to the discharge summary, [facility]. The CSW is signing off. Please consult should the patient remain at [facility]/is unable to transfer.  Argentina PonderKaren Martha Kooper Godshall, MSW, Theresia MajorsLCSWA [phone]

## 2018-08-31 NOTE — Progress Notes (Signed)
[facility] transfer to [id number].  Called report to Maralyn SagoSarah, Charity fundraiserN.  Transfer papers/ radiology disc placed in packet.  Awaiting [facility].

## 2018-08-31 NOTE — Progress Notes (Signed)
Called to restart PIV. Noted bilateral arm 3+edema. Assessed patient with ultrasound. Had difficulty threading angio cath. Obtained PIV in right hand. Notified primary RN to obtain PICC order if PIV infiltrates.

## 2018-09-01 LAB — CULTURE, BLOOD (ROUTINE X 2)
Culture: NO GROWTH
Culture: NO GROWTH
Special Requests: ADEQUATE

## 2018-11-14 ENCOUNTER — Emergency Department
Admission: EM | Admit: 2018-11-14 | Discharge: 2018-11-14 | Disposition: A | Discharge status: Home / Self Care | Payer: Medicare Other | Attending: Emergency Medicine | Admitting: Emergency Medicine

## 2018-11-14 ENCOUNTER — Emergency Department: Payer: Medicare Other

## 2018-11-14 ENCOUNTER — Other Ambulatory Visit: Payer: Self-pay

## 2018-11-14 DIAGNOSIS — Z79899 Other long term (current) drug therapy: Secondary | ICD-10-CM | POA: Diagnosis not present

## 2018-11-14 DIAGNOSIS — Z794 Long term (current) use of insulin: Secondary | ICD-10-CM | POA: Diagnosis not present

## 2018-11-14 DIAGNOSIS — N390 Urinary tract infection, site not specified: Secondary | ICD-10-CM | POA: Insufficient documentation

## 2018-11-14 DIAGNOSIS — E119 Type 2 diabetes mellitus without complications: Secondary | ICD-10-CM | POA: Insufficient documentation

## 2018-11-14 DIAGNOSIS — R112 Nausea with vomiting, unspecified: Secondary | ICD-10-CM | POA: Diagnosis present

## 2018-11-14 DIAGNOSIS — I10 Essential (primary) hypertension: Secondary | ICD-10-CM | POA: Insufficient documentation

## 2018-11-14 DIAGNOSIS — Z8673 Personal history of transient ischemic attack (TIA), and cerebral infarction without residual deficits: Secondary | ICD-10-CM | POA: Insufficient documentation

## 2018-11-14 DIAGNOSIS — J101 Influenza due to other identified influenza virus with other respiratory manifestations: Secondary | ICD-10-CM | POA: Insufficient documentation

## 2018-11-14 LAB — CBC
HCT: 42.7 % (ref 39.0–52.0)
Hemoglobin: 13 g/dL (ref 13.0–17.0)
MCH: 29.8 pg (ref 26.0–34.0)
MCHC: 30.4 g/dL (ref 30.0–36.0)
MCV: 97.9 fL (ref 80.0–100.0)
Platelets: 332 10*3/uL (ref 150–400)
RBC: 4.36 MIL/uL (ref 4.22–5.81)
RDW: 17.7 % — ABNORMAL HIGH (ref 11.5–15.5)
WBC: 6.1 10*3/uL (ref 4.0–10.5)
nRBC: 0 % (ref 0.0–0.2)

## 2018-11-14 LAB — COMPREHENSIVE METABOLIC PANEL
ALT: 7 U/L (ref 0–44)
AST: 28 U/L (ref 15–41)
Albumin: 3.3 g/dL — ABNORMAL LOW (ref 3.5–5.0)
Alkaline Phosphatase: 103 U/L (ref 38–126)
Anion gap: 14 (ref 5–15)
BUN: 11 mg/dL (ref 8–23)
CO2: 22 mmol/L (ref 22–32)
Calcium: 8.8 mg/dL — ABNORMAL LOW (ref 8.9–10.3)
Chloride: 106 mmol/L (ref 98–111)
Creatinine, Ser: 0.67 mg/dL (ref 0.61–1.24)
GFR calc Af Amer: >60 mL/min (ref >60)
GFR calc non Af Amer: >60 mL/min (ref >60)
Glucose, Bld: 126 mg/dL — ABNORMAL HIGH (ref 70–99)
Potassium: 4.8 mmol/L (ref 3.5–5.1)
Sodium: 142 mmol/L (ref 135–145)
Total Bilirubin: 1.8 mg/dL — ABNORMAL HIGH (ref 0.3–1.2)
Total Protein: 6.8 g/dL (ref 6.5–8.1)

## 2018-11-14 LAB — URINALYSIS, COMPLETE (UACMP) WITH MICROSCOPIC
Bacteria, UA: NONE SEEN
Bilirubin Urine: NEGATIVE
GLUCOSE, UA: NEGATIVE mg/dL
Ketones, ur: 20 mg/dL — AB
Nitrite: NEGATIVE
Protein, ur: 30 mg/dL — AB
Specific Gravity, Urine: 1.019 (ref 1.005–1.030)
pH: 5 (ref 5.0–8.0)

## 2018-11-14 LAB — INFLUENZA PANEL BY PCR (TYPE A & B)
Influenza A By PCR: POSITIVE — AB
Influenza B By PCR: NEGATIVE

## 2018-11-14 LAB — TROPONIN I: Troponin I: <0.03 ng/mL (ref <0.03)

## 2018-11-14 MED ORDER — SODIUM CHLORIDE 0.9 % IV SOLN
1.0000 g | Freq: Once | INTRAVENOUS | Status: AC
  Administered 2018-11-14: 1 g via INTRAVENOUS
  Filled 2018-11-14: qty 10

## 2018-11-14 MED ORDER — SODIUM CHLORIDE 0.9 % IV BOLUS
1000.0000 mL | Freq: Once | INTRAVENOUS | Status: AC
  Administered 2018-11-14: 1000 mL via INTRAVENOUS

## 2018-11-14 MED ORDER — CEPHALEXIN 500 MG PO CAPS: 500.0000 mg | ORAL_CAPSULE | Freq: Two times a day (BID) | ORAL | 0 refills | Status: DC

## 2018-11-14 MED ORDER — OSELTAMIVIR PHOSPHATE 75 MG PO CAPS
75.0000 mg | ORAL_CAPSULE | Freq: Once | ORAL | Status: AC
  Administered 2018-11-14: 75 mg via ORAL
  Filled 2018-11-14: qty 1

## 2018-11-14 MED ORDER — OSELTAMIVIR PHOSPHATE 75 MG PO CAPS: 75.0000 mg | ORAL_CAPSULE | Freq: Two times a day (BID) | ORAL | 0 refills | Status: AC

## 2018-11-14 NOTE — ED Provider Notes (Signed)
[facility] Emergency Department Provider Note  Time seen: 10:07 AM  I have reviewed the triage vital signs and the nursing notes.   HISTORY  Chief Complaint Emesis    HPI NADARIUS SUNDERLIN is a 68 y.o. male with a past medical history of diabetes, hypertension, CVA, brain injury, presents to the emergency department for nausea vomiting weakness.  According to the patient over the past 2 to 3 days he has been very nauseated with occasional episodes of vomiting.  Patient states he has been feeling very weak with occasional cough as well.  Denies sputum production.  Patient states he has had frequent urinary tract infections in the past with similar presentations.  Denies any known fever.  Denies any chest pain or abdominal pain.  Currently the patient appears well, no distress.  Lying in bed answering questions and following commands appropriately.   Past Medical History:  Diagnosis Date  . Diabetes mellitus without complication (HCC)   . Hypertension   . Stroke [facility])     Patient Active Problem List   Diagnosis Date Noted  . Protein-calorie malnutrition, severe 08/30/2018  . Acute renal failure (ARF) (HCC) 08/27/2018    Past Surgical History:  Procedure Laterality Date  . BRAIN SURGERY    . CARDIAC SURGERY      Prior to Admission medications   Medication Sig Start Date End Date Taking? Authorizing Provider  acetaminophen (TYLENOL) 500 MG tablet Take 1,000 mg by mouth every 8 (eight) hours as needed.    [provider]  amLODipine (NORVASC) 10 MG tablet Take 10 mg by mouth daily.    [provider]  haloperidol lactate (HALDOL) 5 MG/ML injection Inject 0.5 mLs (2.5 mg total) into the vein every 6 (six) hours as needed (Hold if QTc>427ms). 08/31/18   Alford Highland, MD  hydrALAZINE (APRESOLINE) 20 MG/ML injection Inject 0.5 mLs (10 mg total) into the vein every 6 (six) hours as needed (For SBP >150). 08/31/18   Alford Highland, MD   insulin aspart (NOVOLOG) 100 UNIT/ML injection Inject 0-5 Units into the skin at bedtime. 08/31/18   Alford Highland, MD  insulin aspart (NOVOLOG) 100 UNIT/ML injection Inject 0-9 Units into the skin 3 (three) times daily with meals. 08/31/18   Alford Highland, MD  insulin detemir (LEVEMIR) 100 UNIT/ML injection Inject 0.06 mLs (6 Units total) into the skin daily. 08/31/18   Alford Highland, MD  levETIRAcetam (KEPRRA) 500 MG/100ML SOLN Inject 100 mLs (500 mg total) into the vein every 12 (twelve) hours. 08/31/18   Alford Highland, MD  LORazepam (ATIVAN) 2 MG/ML injection Inject 0.5 mLs (1 mg total) into the vein every 4 (four) hours as needed for seizure. 08/31/18   Alford Highland, MD  metoCLOPramide (REGLAN) 5 MG/ML injection Inject 1 mL (5 mg total) into the vein every 6 (six) hours. 08/31/18   Alford Highland, MD  metoprolol tartrate (LOPRESSOR) 5 MG/5ML SOLN injection Inject 5 mLs (5 mg total) into the vein every 6 (six) hours as needed. 08/31/18   Alford Highland, MD  mupirocin ointment (BACTROBAN) 2 % Place 1 application into the nose 2 (two) times daily. 08/31/18   Alford Highland, MD  OLANZapine zydis (ZYPREXA) 5 MG disintegrating tablet Take 0.5 tablets (2.5 mg total) by mouth every 6 (six) hours as needed (for severe agitation or hallucinations). 08/31/18   Alford Highland, MD  ondansetron (ZOFRAN) 4 MG/2ML SOLN injection Inject 2 mLs (4 mg total) into the vein every 6 (six) hours as needed for  nausea. 08/31/18   Alford Highland, MD  pantoprazole (PROTONIX) 40 MG injection Inject 40 mg into the vein every 12 (twelve) hours. 08/31/18   Alford Highland, MD  sertraline (ZOLOFT) 100 MG tablet Take 100 mg by mouth at bedtime.    [provider]  sodium chloride 0.9 % infusion Inject 50 mLs into the vein continuous. 08/31/18   Alford Highland, MD  tamsulosin (FLOMAX) 0.4 MG CAPS capsule Take 0.4 mg by mouth at bedtime.    [provider]  vancomycin (VANCOCIN) 1-5 GM/200ML-%  SOLN Inject 200 mLs (1,000 mg total) into the vein every 18 (eighteen) hours. 08/31/18   Alford Highland, MD    No Known Allergies  Family History  Problem Relation Age of Onset  . Kidney disease Mother   . Dementia Father   . Cancer Father   . Stomach cancer Sister     Social History Social History   Tobacco Use  . Smoking status: Never Smoker  . Smokeless tobacco: Never Used  Substance Use Topics  . Alcohol use: Never    Frequency: Never  . Drug use: Never    Review of Systems Constitutional: Negative for fever.  Positive for generalized fatigue/weakness. Cardiovascular: Negative for chest pain. Respiratory: Negative for shortness of breath.  Occasional cough. Gastrointestinal: Negative for abdominal pain.  Positive for nausea vomiting. Genitourinary: Negative for urinary compaints Musculoskeletal: Negative for musculoskeletal complaints Skin: Negative for skin complaints  Neurological: Negative for headache All other ROS negative  ____________________________________________   PHYSICAL EXAM:  Constitutional: Alert and oriented.  Chronically ill-appearing, no acute distress. Eyes: Normal exam ENT   Head: Normocephalic and atraumatic.   Mouth/Throat: Mucous membranes are moist. Cardiovascular: Normal rate, regular rhythm Respiratory: Normal respiratory effort without tachypnea nor retractions. Breath sounds are clear  Gastrointestinal: Soft and nontender. No distention.  Musculoskeletal: Nontender with normal range of motion in all extremities Neurologic:  Normal speech and language.  Chronic left-sided weakness. Skin:  Skin is warm, dry and intact.  Psychiatric: Mood and affect are normal.  ____________________________________________    RADIOLOGY  Chest x-ray negative  ____________________________________________   INITIAL IMPRESSION / ASSESSMENT AND PLAN / ED COURSE  Pertinent labs & imaging results that were available during my care of the  patient were reviewed by me and considered in my medical decision making (see chart for details).  Patient presents emergency department with symptoms of generalized fatigue weakness along with nausea and vomiting occasional cough.  Differential would include metabolic abnormality, URI, pneumonia, ACS, influenza, UTI.  We will check labs, urinalysis, influenza swab, chest x-ray, treat with IV fluids and continue to closely monitor.  Patient agreeable plan of care.  Patient's labs show a likely urinary tract infection.  Will cover with Rocephin and send a urine culture.  Patient's influenza test is positive for influenza A.  We will cover with Tamiflu.  Patient does not ambulate at baseline per patient.  Lives at home with wife.  Feels comfortable going home.  We will place on Tamiflu and Keflex have the patient follow-up with his PCP.  Patient continues to appear well clinically in the emergency department.  ____________________________________________   FINAL CLINICAL IMPRESSION(S) / ED DIAGNOSES  Weakness Nausea vomiting Urinary tract infection Influenza A   Minna Antis, MD 11/14/18 1407

## 2018-11-14 NOTE — Discharge Instructions (Addendum)
Please take your medications as prescribed.  Please follow-up with your primary care doctor in 2 to 3 days for recheck/reevaluation.  Please complete your course of Tamiflu.

## 2018-11-14 NOTE — ED Notes (Signed)
Pt's wife called and stated that the pt was recently exposed to the flu.

## 2018-11-14 NOTE — ED Notes (Signed)
IV team at bedside at this time. 
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
## 2018-11-14 NOTE — ED Notes (Signed)
[name] Ure pt wife called for update. Asking if pt was to be admited ot discharged. RN informed pt wife that we will update when RN aware of pt status [phone]

## 2018-11-14 NOTE — ED Notes (Signed)
Pt requesting ice chips, or water. Pt cleared by MD to drink fluids. Pt given cup of ice chips and cup of water at this time

## 2018-11-14 NOTE — ED Notes (Signed)
Attempted to contact pt wife at this time. No answer. Message left on voicemail to call this RN back at [phone]

## 2018-11-14 NOTE — ED Notes (Signed)
Pt condom catheter removed and new condom cath placed on pt to obtain urine specimen. Peri care completed during process to ensure the cleanest sample possible is collected.

## 2018-11-15 LAB — URINE CULTURE: Culture: <10000 — AB

## 2019-06-28 ENCOUNTER — Inpatient Hospital Stay
Admission: EM | Admit: 2019-06-28 | Discharge: 2019-07-02 | DRG: 313 | Disposition: A | Discharge status: Hospice | Payer: Medicare Other | Attending: Internal Medicine | Admitting: Internal Medicine

## 2019-06-28 ENCOUNTER — Other Ambulatory Visit: Payer: Self-pay

## 2019-06-28 ENCOUNTER — Emergency Department: Payer: Medicare Other

## 2019-06-28 DIAGNOSIS — Z20828 Contact with and (suspected) exposure to other viral communicable diseases: Secondary | ICD-10-CM | POA: Diagnosis present

## 2019-06-28 DIAGNOSIS — Z515 Encounter for palliative care: Secondary | ICD-10-CM | POA: Diagnosis present

## 2019-06-28 DIAGNOSIS — Z818 Family history of other mental and behavioral disorders: Secondary | ICD-10-CM

## 2019-06-28 DIAGNOSIS — E119 Type 2 diabetes mellitus without complications: Secondary | ICD-10-CM

## 2019-06-28 DIAGNOSIS — Z841 Family history of disorders of kidney and ureter: Secondary | ICD-10-CM

## 2019-06-28 DIAGNOSIS — R079 Chest pain, unspecified: Secondary | ICD-10-CM | POA: Diagnosis not present

## 2019-06-28 DIAGNOSIS — Z7982 Long term (current) use of aspirin: Secondary | ICD-10-CM

## 2019-06-28 DIAGNOSIS — Z79899 Other long term (current) drug therapy: Secondary | ICD-10-CM

## 2019-06-28 DIAGNOSIS — R739 Hyperglycemia, unspecified: Secondary | ICD-10-CM | POA: Diagnosis present

## 2019-06-28 DIAGNOSIS — E1165 Type 2 diabetes mellitus with hyperglycemia: Secondary | ICD-10-CM | POA: Diagnosis present

## 2019-06-28 DIAGNOSIS — J9811 Atelectasis: Secondary | ICD-10-CM | POA: Diagnosis present

## 2019-06-28 DIAGNOSIS — N390 Urinary tract infection, site not specified: Secondary | ICD-10-CM | POA: Diagnosis present

## 2019-06-28 DIAGNOSIS — I2510 Atherosclerotic heart disease of native coronary artery without angina pectoris: Secondary | ICD-10-CM | POA: Diagnosis present

## 2019-06-28 DIAGNOSIS — Z23 Encounter for immunization: Secondary | ICD-10-CM

## 2019-06-28 DIAGNOSIS — Z800 Family history of malignant neoplasm of digestive organs: Secondary | ICD-10-CM

## 2019-06-28 DIAGNOSIS — Z8673 Personal history of transient ischemic attack (TIA), and cerebral infarction without residual deficits: Secondary | ICD-10-CM

## 2019-06-28 DIAGNOSIS — Z951 Presence of aortocoronary bypass graft: Secondary | ICD-10-CM

## 2019-06-28 DIAGNOSIS — Z7189 Other specified counseling: Secondary | ICD-10-CM

## 2019-06-28 DIAGNOSIS — I10 Essential (primary) hypertension: Secondary | ICD-10-CM | POA: Diagnosis present

## 2019-06-28 DIAGNOSIS — A419 Sepsis, unspecified organism: Secondary | ICD-10-CM | POA: Diagnosis not present

## 2019-06-28 DIAGNOSIS — R6521 Severe sepsis with septic shock: Secondary | ICD-10-CM | POA: Diagnosis not present

## 2019-06-28 DIAGNOSIS — Z7401 Bed confinement status: Secondary | ICD-10-CM

## 2019-06-28 DIAGNOSIS — Z794 Long term (current) use of insulin: Secondary | ICD-10-CM

## 2019-06-28 LAB — BASIC METABOLIC PANEL
Anion gap: 14 (ref 5–15)
BUN: 18 mg/dL (ref 8–23)
CO2: 24 mmol/L (ref 22–32)
Calcium: 8.7 mg/dL — ABNORMAL LOW (ref 8.9–10.3)
Chloride: 95 mmol/L — ABNORMAL LOW (ref 98–111)
Creatinine, Ser: 0.5 mg/dL — ABNORMAL LOW (ref 0.61–1.24)
GFR calc Af Amer: >60 mL/min (ref >60)
GFR calc non Af Amer: >60 mL/min (ref >60)
Glucose, Bld: 474 mg/dL — ABNORMAL HIGH (ref 70–99)
Potassium: 4.5 mmol/L (ref 3.5–5.1)
Sodium: 133 mmol/L — ABNORMAL LOW (ref 135–145)

## 2019-06-28 LAB — SARS CORONAVIRUS 2 BY RT PCR (HOSPITAL ORDER, PERFORMED IN ~~LOC~~ HOSPITAL LAB): SARS Coronavirus 2: NEGATIVE

## 2019-06-28 LAB — MAGNESIUM: Magnesium: 1.6 mg/dL — ABNORMAL LOW (ref 1.7–2.4)

## 2019-06-28 LAB — PHOSPHORUS: Phosphorus: 3.4 mg/dL (ref 2.5–4.6)

## 2019-06-28 NOTE — ED Provider Notes (Signed)
[facility] Emergency Department Provider Note  ____________________________________________  Time seen: Approximately 11:54 PM  I have reviewed the triage vital signs and the nursing notes.   HISTORY  Chief Complaint Chest Pain    HPI Alan Newman is a 68 y.o. male with a history of diabetes hypertension stroke and CAD who comes the ED complaining of acute onset of central chest pain, nonradiating, associated vomiting nausea shortness of breath and diaphoresis, started at about 8:30 PM.  Constant, 10/10.  Improved to 5/10 after EMS gave her nitroglycerin spray.  They also gave 324 mg of aspirin.  No aggravating or alleviating factors, patient is bedbound due to prior stroke.      Past Medical History:  Diagnosis Date  . Diabetes mellitus without complication ([place])   . Hypertension   . Stroke [facility])      Patient Active Problem List   Diagnosis Date Noted  . Protein-calorie malnutrition, severe 08/30/2018  . Acute renal failure (ARF) ([place]) 08/27/2018     Past Surgical History:  Procedure Laterality Date  . BRAIN SURGERY    . CARDIAC SURGERY       Prior to Admission medications   Medication Sig Start Date End Date Taking? Authorizing Provider  acetaminophen (TYLENOL) 500 MG tablet Take 1,000 mg by mouth every 8 (eight) hours as needed.    [provider]  amLODipine (NORVASC) 10 MG tablet Take 10 mg by mouth daily.    [provider]  cephALEXin (KEFLEX) 500 MG capsule Take 1 capsule (500 mg total) by mouth 2 (two) times daily. 11/14/18   Harvest Dark, MD  haloperidol lactate (HALDOL) 5 MG/ML injection Inject 0.5 mLs (2.5 mg total) into the vein every 6 (six) hours as needed (Hold if QTc>4105ms). Patient not taking: Reported on 11/14/2018 08/31/18   Loletha Grayer, MD  hydrALAZINE (APRESOLINE) 20 MG/ML injection Inject 0.5 mLs (10 mg total) into the vein every 6 (six) hours as needed (For SBP >150). 08/31/18   Loletha Grayer, MD  insulin aspart (NOVOLOG) 100 UNIT/ML injection Inject 0-5 Units into the skin at bedtime. 08/31/18   Loletha Grayer, MD  insulin aspart (NOVOLOG) 100 UNIT/ML injection Inject 0-9 Units into the skin 3 (three) times daily with meals. 08/31/18   Loletha Grayer, MD  insulin detemir (LEVEMIR) 100 UNIT/ML injection Inject 0.06 mLs (6 Units total) into the skin daily. 08/31/18   Loletha Grayer, MD  levETIRAcetam (KEPRRA) 500 MG/100ML SOLN Inject 100 mLs (500 mg total) into the vein every 12 (twelve) hours. 08/31/18   Loletha Grayer, MD  LORazepam (ATIVAN) 2 MG/ML injection Inject 0.5 mLs (1 mg total) into the vein every 4 (four) hours as needed for seizure. 08/31/18   Loletha Grayer, MD  metoCLOPramide (REGLAN) 5 MG/ML injection Inject 1 mL (5 mg total) into the vein every 6 (six) hours. 08/31/18   Loletha Grayer, MD  metoprolol tartrate (LOPRESSOR) 5 MG/5ML SOLN injection Inject 5 mLs (5 mg total) into the vein every 6 (six) hours as needed. 08/31/18   Loletha Grayer, MD  mupirocin ointment (BACTROBAN) 2 % Place 1 application into the nose 2 (two) times daily. 08/31/18   Loletha Grayer, MD  OLANZapine zydis (ZYPREXA) 5 MG disintegrating tablet Take 0.5 tablets (2.5 mg total) by mouth every 6 (six) hours as needed (for severe agitation or hallucinations). 08/31/18   Loletha Grayer, MD  ondansetron (ZOFRAN) 4 MG/2ML SOLN injection Inject 2 mLs (4 mg total) into the vein every 6 (six) hours as  needed for nausea. 08/31/18   Alford Highland, MD  pantoprazole (PROTONIX) 40 MG injection Inject 40 mg into the vein every 12 (twelve) hours. 08/31/18   Alford Highland, MD  sertraline (ZOLOFT) 100 MG tablet Take 100 mg by mouth at bedtime.    [provider]  sodium chloride 0.9 % infusion Inject 50 mLs into the vein continuous. 08/31/18   Alford Highland, MD  tamsulosin (FLOMAX) 0.4 MG CAPS capsule Take 0.4 mg by mouth at bedtime.    [provider]  vancomycin (VANCOCIN) 1-5  GM/200ML-% SOLN Inject 200 mLs (1,000 mg total) into the vein every 18 (eighteen) hours. 08/31/18   Alford Highland, MD     Allergies Patient has no known allergies.   Family History  Problem Relation Age of Onset  . Kidney disease Mother   . Dementia Father   . Cancer Father   . Stomach cancer Sister     Social History Social History   Tobacco Use  . Smoking status: Never Smoker  . Smokeless tobacco: Never Used  Substance Use Topics  . Alcohol use: Never    Frequency: Never  . Drug use: Never    Review of Systems  Constitutional:   No fever or chills.  ENT:   No sore throat. No rhinorrhea. Cardiovascular: Positive chest pain as above without syncope. Respiratory:   No dyspnea or cough. Gastrointestinal:   Negative for abdominal pain, vomiting and diarrhea.  Musculoskeletal:   Negative for focal pain or swelling All other systems reviewed and are negative except as documented above in ROS and HPI.  ____________________________________________   PHYSICAL EXAM:  VITAL SIGNS: ED Triage Vitals [06/28/19 2130]  Enc Vitals Group     BP (!) 158/102     Pulse Rate 78     Resp 18     Temp 98.9 F (37.2 C)     Temp Source Oral     SpO2 100 %     Weight 146 lb 6.2 oz (66.4 kg)     Height 5\' 9"  (1.753 m)     Head Circumference      Peak Flow      Pain Score 5     Pain Loc      Pain Edu?      Excl. in GC?     Vital signs reviewed, nursing assessments reviewed.   Constitutional:   Alert and oriented. Non-toxic appearance. Eyes:   Conjunctivae are normal. EOMI. PERRL. ENT      Head:   Normocephalic and atraumatic.      Nose:   Wearing a mask.      Mouth/Throat:   Wearing a mask.      Neck:   No meningismus. Full ROM. Hematological/Lymphatic/Immunilogical:   No cervical lymphadenopathy. Cardiovascular:   RRR. Symmetric bilateral radial and DP pulses.  No murmurs. Cap refill less than 2 seconds. Respiratory:   Normal respiratory effort without  tachypnea/retractions. Breath sounds are clear and equal bilaterally. No wheezes/rales/rhonchi. Gastrointestinal:   Soft and nontender. Non distended. There is no CVA tenderness.  No rebound, rigidity, or guarding.  Musculoskeletal:   Normal range of motion in all extremities. No joint effusions.  No lower extremity tenderness.  No edema. Neurologic:   Normal speech and language.  Motor grossly intact. No acute focal neurologic deficits are appreciated.  Skin:    Skin is warm, dry and intact. No rash noted.  No petechiae, purpura, or bullae.  ____________________________________________    LABS (pertinent positives/negatives) (all  labs ordered are listed, but only abnormal results are displayed) Labs Reviewed  BASIC METABOLIC PANEL - Abnormal; Notable for the following components:      Result Value   Potassium <2.0 (*)    Chloride 126 (*)    CO2 12 (*)    Glucose, Bld 268 (*)    Creatinine, Ser <0.30 (*)    Calcium 4.1 (*)    All other components within normal limits  CBC WITH DIFFERENTIAL/PLATELET - Abnormal; Notable for the following components:   RBC 4.18 (*)    Hemoglobin 12.5 (*)    All other components within normal limits  BASIC METABOLIC PANEL - Abnormal; Notable for the following components:   Sodium 133 (*)    Chloride 95 (*)    Glucose, Bld 474 (*)    Creatinine, Ser 0.50 (*)    Calcium 8.7 (*)    All other components within normal limits  MAGNESIUM - Abnormal; Notable for the following components:   Magnesium 1.6 (*)    All other components within normal limits  SARS CORONAVIRUS 2 (HOSPITAL ORDER, PERFORMED IN [facility])  PHOSPHORUS  TROPONIN I (HIGH SENSITIVITY)  TROPONIN I (HIGH SENSITIVITY)   ____________________________________________   EKG  Interpreted by me Sinus rhythm rate of 75, normal axis and intervals.  Normal QRS ST segments and T waves.  ____________________________________________    RADIOLOGY  Dg Chest Portable 1  View  Result Date: 06/28/2019 CLINICAL DATA:  Chest pain EXAM: PORTABLE CHEST 1 VIEW COMPARISON:  11/14/2018, 08/27/2018 FINDINGS: Post sternotomy changes. Subsegmental atelectasis in the right lower lung. Mild airspace disease at the left base. Posttraumatic and postsurgical deformity of the left thorax with evidence of multiple prior rib fractures. Old mid left clavicle fracture. Stable cardiomediastinal silhouette. IMPRESSION: 1. Patchy opacity at the left base may reflect atelectasis or minimal infiltrate. 2. Subsegmental atelectasis in the right lower lung Electronically Signed   By: Jasmine Pang M.D.   On: 06/28/2019 21:57    ____________________________________________   PROCEDURES Procedures  ____________________________________________  DIFFERENTIAL DIAGNOSIS   Non-STEMI, pneumothorax, pneumonia, pulmonary edema, COVID-19  CLINICAL IMPRESSION / ASSESSMENT AND PLAN / ED COURSE  Medications ordered in the ED: Medications - No data to display  Pertinent labs & imaging results that were available during my care of the patient were reviewed by me and considered in my medical decision making (see chart for details).  Alan Newman was evaluated in Emergency Department on 06/28/2019 for the symptoms described in the history of present illness. He was evaluated in the context of the global COVID-19 pandemic, which necessitated consideration that the patient might be at risk for infection with the SARS-CoV-2 virus that causes COVID-19. Institutional protocols and algorithms that pertain to the evaluation of patients at risk for COVID-19 are in a state of rapid change based on information released by regulatory bodies including the CDC and federal and state organizations. These policies and algorithms were followed during the patient's care in the ED.   Patient presents with sudden onset of precordial chest pain, concerning for non-STEMI given his cardiac risk factors.  Vital signs  overall unremarkable in the ED.  Exam is reassuring.  Initial chemistry is very abnormal which appears to be erroneous, and moreover than expected limits on redraw.  Initial troponin negative, possibly diluted.  Follow-up second troponin.  Recommended hospitalization for telemetry and further cardiac work-up which patient agrees.  COVID negative.      ____________________________________________   FINAL CLINICAL IMPRESSION(S) /  ED DIAGNOSES    Final diagnoses:  Chest pain with moderate risk for cardiac etiology     ED Discharge Orders    None      Portions of this note were generated with dragon dictation software. Dictation errors may occur despite best attempts at proofreading.   Sharman CheekStafford, Charmon Thorson, MD 06/28/19 2356

## 2019-06-28 NOTE — ED Triage Notes (Signed)
Pt from home with onset of central chest pain and vomiting/nausea one hour pta. Per ems pt with large episode of emesis. Ems gave 324mg  asa, one spray ntg, 570mL ns bolus, 4mg  zofran iv. fsbs 477 per ems. Initial pain 10/10, pt reports pain 5/10 currently. Ems states 91% on ra, placed on 2lpm oxygen.

## 2019-06-28 NOTE — ED Notes (Signed)
Pt cleansed of incontinent bowel movement and repositioned in bed.

## 2019-06-28 NOTE — ED Notes (Signed)
Lab here for venipuncture assist.  

## 2019-06-29 ENCOUNTER — Observation Stay
Admit: 2019-06-29 | Discharge: 2019-06-29 | Disposition: A | Discharge status: Home / Self Care | Payer: Medicare Other | Attending: Internal Medicine | Admitting: Internal Medicine

## 2019-06-29 DIAGNOSIS — A419 Sepsis, unspecified organism: Secondary | ICD-10-CM | POA: Diagnosis not present

## 2019-06-29 DIAGNOSIS — Z23 Encounter for immunization: Secondary | ICD-10-CM | POA: Diagnosis present

## 2019-06-29 DIAGNOSIS — N390 Urinary tract infection, site not specified: Secondary | ICD-10-CM | POA: Diagnosis present

## 2019-06-29 DIAGNOSIS — Z794 Long term (current) use of insulin: Secondary | ICD-10-CM | POA: Diagnosis not present

## 2019-06-29 DIAGNOSIS — Z79899 Other long term (current) drug therapy: Secondary | ICD-10-CM | POA: Diagnosis not present

## 2019-06-29 DIAGNOSIS — R079 Chest pain, unspecified: Secondary | ICD-10-CM | POA: Diagnosis present

## 2019-06-29 DIAGNOSIS — I10 Essential (primary) hypertension: Secondary | ICD-10-CM | POA: Diagnosis present

## 2019-06-29 DIAGNOSIS — Z515 Encounter for palliative care: Secondary | ICD-10-CM | POA: Diagnosis present

## 2019-06-29 DIAGNOSIS — Z8673 Personal history of transient ischemic attack (TIA), and cerebral infarction without residual deficits: Secondary | ICD-10-CM | POA: Diagnosis not present

## 2019-06-29 DIAGNOSIS — Z7401 Bed confinement status: Secondary | ICD-10-CM | POA: Diagnosis not present

## 2019-06-29 DIAGNOSIS — Z20828 Contact with and (suspected) exposure to other viral communicable diseases: Secondary | ICD-10-CM | POA: Diagnosis present

## 2019-06-29 DIAGNOSIS — E119 Type 2 diabetes mellitus without complications: Secondary | ICD-10-CM

## 2019-06-29 DIAGNOSIS — Z951 Presence of aortocoronary bypass graft: Secondary | ICD-10-CM | POA: Diagnosis not present

## 2019-06-29 DIAGNOSIS — Z800 Family history of malignant neoplasm of digestive organs: Secondary | ICD-10-CM | POA: Diagnosis not present

## 2019-06-29 DIAGNOSIS — Z7189 Other specified counseling: Secondary | ICD-10-CM | POA: Diagnosis not present

## 2019-06-29 DIAGNOSIS — Z7982 Long term (current) use of aspirin: Secondary | ICD-10-CM | POA: Diagnosis not present

## 2019-06-29 DIAGNOSIS — Z818 Family history of other mental and behavioral disorders: Secondary | ICD-10-CM | POA: Diagnosis not present

## 2019-06-29 DIAGNOSIS — R739 Hyperglycemia, unspecified: Secondary | ICD-10-CM | POA: Diagnosis present

## 2019-06-29 DIAGNOSIS — I2510 Atherosclerotic heart disease of native coronary artery without angina pectoris: Secondary | ICD-10-CM | POA: Diagnosis present

## 2019-06-29 DIAGNOSIS — E1165 Type 2 diabetes mellitus with hyperglycemia: Secondary | ICD-10-CM | POA: Diagnosis present

## 2019-06-29 DIAGNOSIS — Z841 Family history of disorders of kidney and ureter: Secondary | ICD-10-CM | POA: Diagnosis not present

## 2019-06-29 DIAGNOSIS — J9811 Atelectasis: Secondary | ICD-10-CM | POA: Diagnosis present

## 2019-06-29 DIAGNOSIS — R6521 Severe sepsis with septic shock: Secondary | ICD-10-CM | POA: Diagnosis not present

## 2019-06-29 LAB — CBC WITH DIFFERENTIAL/PLATELET
Abs Immature Granulocytes: UNDETERMINED 10*3/uL (ref 0.00–0.07)
Basophils Absolute: UNDETERMINED 10*3/uL (ref 0.0–0.1)
Basophils Relative: UNDETERMINED %
Eosinophils Absolute: UNDETERMINED 10*3/uL (ref 0.0–0.5)
Eosinophils Relative: UNDETERMINED %
HCT: UNDETERMINED % (ref 39.0–52.0)
Hemoglobin: UNDETERMINED g/dL (ref 13.0–17.0)
Immature Granulocytes: UNDETERMINED %
Lymphocytes Relative: UNDETERMINED %
Lymphs Abs: UNDETERMINED 10*3/uL (ref 0.7–4.0)
MCH: UNDETERMINED pg (ref 26.0–34.0)
MCHC: UNDETERMINED g/dL (ref 30.0–36.0)
MCV: UNDETERMINED fL (ref 80.0–100.0)
Monocytes Absolute: UNDETERMINED 10*3/uL (ref 0.1–1.0)
Monocytes Relative: UNDETERMINED %
Neutro Abs: UNDETERMINED 10*3/uL (ref 1.7–7.7)
Neutrophils Relative %: UNDETERMINED %
Platelets: UNDETERMINED 10*3/uL (ref 150–400)
RBC: UNDETERMINED MIL/uL (ref 4.22–5.81)
RDW: UNDETERMINED % (ref 11.5–15.5)
WBC: UNDETERMINED 10*3/uL (ref 4.0–10.5)
nRBC: UNDETERMINED % (ref 0.0–0.2)

## 2019-06-29 LAB — BASIC METABOLIC PANEL
Anion gap: UNDETERMINED (ref 5–15)
Anion gap: 12 (ref 5–15)
BUN: UNDETERMINED mg/dL (ref 8–23)
BUN: 17 mg/dL (ref 8–23)
CO2: UNDETERMINED mmol/L (ref 22–32)
CO2: 29 mmol/L (ref 22–32)
Calcium: UNDETERMINED mg/dL (ref 8.9–10.3)
Calcium: 9 mg/dL (ref 8.9–10.3)
Chloride: UNDETERMINED mmol/L (ref 98–111)
Chloride: 94 mmol/L — ABNORMAL LOW (ref 98–111)
Creatinine, Ser: UNDETERMINED mg/dL (ref 0.61–1.24)
Creatinine, Ser: 0.61 mg/dL (ref 0.61–1.24)
GFR calc Af Amer: >60 mL/min (ref >60)
GFR calc non Af Amer: >60 mL/min (ref >60)
Glucose, Bld: UNDETERMINED mg/dL (ref 70–99)
Glucose, Bld: 439 mg/dL — ABNORMAL HIGH (ref 70–99)
Potassium: UNDETERMINED mmol/L (ref 3.5–5.1)
Potassium: 4.4 mmol/L (ref 3.5–5.1)
Sodium: UNDETERMINED mmol/L (ref 135–145)
Sodium: 135 mmol/L (ref 135–145)

## 2019-06-29 LAB — TROPONIN I (HIGH SENSITIVITY)
Troponin I (High Sensitivity): 5 ng/L (ref <18)
Troponin I (High Sensitivity): 5 ng/L (ref <18)
Troponin I (High Sensitivity): 5 ng/L (ref <18)
Troponin I (High Sensitivity): UNDETERMINED ng/L (ref <18)

## 2019-06-29 LAB — GLUCOSE, CAPILLARY
Glucose-Capillary: 412 mg/dL — ABNORMAL HIGH (ref 70–99)
Glucose-Capillary: 330 mg/dL — ABNORMAL HIGH (ref 70–99)
Glucose-Capillary: 344 mg/dL — ABNORMAL HIGH (ref 70–99)
Glucose-Capillary: 372 mg/dL — ABNORMAL HIGH (ref 70–99)

## 2019-06-29 LAB — ECHOCARDIOGRAM COMPLETE
Height: 69 in
Weight: 2120.01 oz

## 2019-06-29 LAB — CREATININE, SERUM
Creatinine, Ser: 0.51 mg/dL — ABNORMAL LOW (ref 0.61–1.24)
GFR calc Af Amer: >60 mL/min (ref >60)
GFR calc non Af Amer: >60 mL/min (ref >60)

## 2019-06-29 LAB — CBC
HCT: 40.7 % (ref 39.0–52.0)
HCT: 41.7 % (ref 39.0–52.0)
Hemoglobin: 13.4 g/dL (ref 13.0–17.0)
Hemoglobin: 14 g/dL (ref 13.0–17.0)
MCH: 29.3 pg (ref 26.0–34.0)
MCH: 30.4 pg (ref 26.0–34.0)
MCHC: 32.9 g/dL (ref 30.0–36.0)
MCHC: 33.6 g/dL (ref 30.0–36.0)
MCV: 88.9 fL (ref 80.0–100.0)
MCV: 90.7 fL (ref 80.0–100.0)
Platelets: 249 10*3/uL (ref 150–400)
Platelets: 233 10*3/uL (ref 150–400)
RBC: 4.58 MIL/uL (ref 4.22–5.81)
RBC: 4.6 MIL/uL (ref 4.22–5.81)
RDW: 13.6 % (ref 11.5–15.5)
RDW: 13.7 % (ref 11.5–15.5)
WBC: 6.9 10*3/uL (ref 4.0–10.5)
WBC: 5.7 10*3/uL (ref 4.0–10.5)
nRBC: 0 % (ref 0.0–0.2)
nRBC: 0 % (ref 0.0–0.2)

## 2019-06-29 LAB — LIPID PANEL
Cholesterol: 185 mg/dL (ref 0–200)
HDL: 35 mg/dL — ABNORMAL LOW (ref >40)
LDL Cholesterol: 117 mg/dL — ABNORMAL HIGH (ref 0–99)
Total CHOL/HDL Ratio: 5.3 RATIO
Triglycerides: 166 mg/dL — ABNORMAL HIGH (ref <150)
VLDL: 33 mg/dL (ref 0–40)

## 2019-06-29 MED ORDER — HYOSCYAMINE SULFATE 0.125 MG PO TBDP
0.1250 mg | ORAL_TABLET | Freq: Four times a day (QID) | ORAL | Status: DC | PRN
  Filled 2019-06-29: qty 1

## 2019-06-29 MED ORDER — LISINOPRIL 10 MG PO TABS
10.0000 mg | ORAL_TABLET | Freq: Two times a day (BID) | ORAL | Status: DC
  Administered 2019-06-29: 10 mg via ORAL
  Filled 2019-06-29 (×3): qty 1

## 2019-06-29 MED ORDER — ENOXAPARIN SODIUM 40 MG/0.4ML ~~LOC~~ SOLN
40.0000 mg | Freq: Every day | SUBCUTANEOUS | Status: DC
  Administered 2019-06-29 – 2019-07-01 (×4): 40 mg via SUBCUTANEOUS
  Filled 2019-06-29 (×4): qty 0.4

## 2019-06-29 MED ORDER — INSULIN ASPART 100 UNIT/ML ~~LOC~~ SOLN
0.0000 [IU] | Freq: Four times a day (QID) | SUBCUTANEOUS | Status: DC
  Administered 2019-06-29 – 2019-06-30 (×3): 7 [IU] via SUBCUTANEOUS
  Filled 2019-06-29 (×3): qty 1

## 2019-06-29 MED ORDER — ORAL CARE MOUTH RINSE
15.0000 mL | Freq: Two times a day (BID) | OROMUCOSAL | Status: DC
  Administered 2019-06-29 – 2019-07-01 (×4): 15 mL via OROMUCOSAL

## 2019-06-29 MED ORDER — INFLUENZA VAC A&B SA ADJ QUAD 0.5 ML IM PRSY
0.5000 mL | PREFILLED_SYRINGE | INTRAMUSCULAR | Status: AC
  Administered 2019-06-30: 12:00:00 0.5 mL via INTRAMUSCULAR
  Filled 2019-06-29: qty 0.5

## 2019-06-29 MED ORDER — SODIUM CHLORIDE 0.9% FLUSH
3.0000 mL | INTRAVENOUS | Status: DC | PRN
  Administered 2019-07-01: 3 mL via INTRAVENOUS
  Filled 2019-06-29: qty 3

## 2019-06-29 MED ORDER — TAMSULOSIN HCL 0.4 MG PO CAPS
0.4000 mg | ORAL_CAPSULE | Freq: Every day | ORAL | Status: DC
  Administered 2019-06-29 – 2019-07-01 (×4): 0.4 mg via ORAL
  Filled 2019-06-29 (×5): qty 1

## 2019-06-29 MED ORDER — METOPROLOL TARTRATE 5 MG/5ML IV SOLN
2.5000 mg | INTRAVENOUS | Status: DC | PRN
  Administered 2019-07-01 (×2): 2.5 mg via INTRAVENOUS
  Filled 2019-06-29 (×2): qty 5

## 2019-06-29 MED ORDER — ACETAMINOPHEN 650 MG RE SUPP: 650.0000 mg | Freq: Four times a day (QID) | RECTAL | Status: DC | PRN

## 2019-06-29 MED ORDER — INSULIN REGULAR HUMAN 100 UNIT/ML IJ SOLN
5.0000 [IU] | Freq: Once | INTRAMUSCULAR | Status: AC
  Administered 2019-06-29: 5 [IU] via INTRAVENOUS
  Filled 2019-06-29: qty 10

## 2019-06-29 MED ORDER — SODIUM CHLORIDE 0.9% FLUSH
3.0000 mL | Freq: Two times a day (BID) | INTRAVENOUS | Status: DC
  Administered 2019-06-29 – 2019-06-30 (×4): 3 mL via INTRAVENOUS

## 2019-06-29 MED ORDER — INSULIN GLARGINE 100 UNIT/ML ~~LOC~~ SOLN
10.0000 [IU] | Freq: Every day | SUBCUTANEOUS | Status: DC
  Administered 2019-06-29: 10 [IU] via SUBCUTANEOUS
  Filled 2019-06-29 (×2): qty 0.1

## 2019-06-29 MED ORDER — ACETAMINOPHEN 325 MG PO TABS
650.0000 mg | ORAL_TABLET | Freq: Four times a day (QID) | ORAL | Status: DC | PRN
  Administered 2019-06-29: 650 mg via ORAL
  Filled 2019-06-29: qty 2

## 2019-06-29 MED ORDER — ASPIRIN 325 MG PO TABS
325.0000 mg | ORAL_TABLET | Freq: Every day | ORAL | Status: DC
  Administered 2019-06-29: 325 mg via ORAL
  Filled 2019-06-29 (×2): qty 1

## 2019-06-29 MED ORDER — LORAZEPAM 0.5 MG PO TABS: 0.5000 mg | ORAL_TABLET | ORAL | Status: DC | PRN

## 2019-06-29 MED ORDER — SODIUM CHLORIDE 0.9 % IV BOLUS
500.0000 mL | Freq: Once | INTRAVENOUS | Status: AC
  Administered 2019-06-29: 22:00:00 500 mL via INTRAVENOUS

## 2019-06-29 MED ORDER — METOPROLOL SUCCINATE ER 50 MG PO TB24
50.0000 mg | ORAL_TABLET | Freq: Every day | ORAL | Status: DC
  Administered 2019-06-29: 50 mg via ORAL
  Filled 2019-06-29 (×2): qty 1

## 2019-06-29 MED ORDER — INSULIN GLARGINE 100 UNIT/ML ~~LOC~~ SOLN
15.0000 [IU] | Freq: Every day | SUBCUTANEOUS | Status: DC
  Filled 2019-06-29: qty 0.15

## 2019-06-29 MED ORDER — OXYCODONE HCL 5 MG PO TABS
5.0000 mg | ORAL_TABLET | ORAL | Status: DC | PRN
  Administered 2019-06-30 – 2019-07-01 (×2): 5 mg via ORAL
  Filled 2019-06-29 (×2): qty 1

## 2019-06-29 MED ORDER — ATORVASTATIN CALCIUM 20 MG PO TABS
40.0000 mg | ORAL_TABLET | Freq: Every day | ORAL | Status: DC
  Administered 2019-06-29 – 2019-07-01 (×3): 40 mg via ORAL
  Filled 2019-06-29 (×3): qty 2

## 2019-06-29 MED ORDER — ONDANSETRON HCL 4 MG/2ML IJ SOLN
4.0000 mg | Freq: Four times a day (QID) | INTRAMUSCULAR | Status: DC | PRN
  Administered 2019-07-01: 4 mg via INTRAVENOUS
  Filled 2019-06-29: qty 2

## 2019-06-29 MED ORDER — METOPROLOL SUCCINATE ER 100 MG PO TB24
100.0000 mg | ORAL_TABLET | Freq: Every morning | ORAL | Status: DC
  Administered 2019-06-29: 13:00:00 100 mg via ORAL
  Filled 2019-06-29 (×2): qty 1

## 2019-06-29 MED ORDER — ONDANSETRON HCL 4 MG PO TABS: 4.0000 mg | ORAL_TABLET | Freq: Four times a day (QID) | ORAL | Status: DC | PRN

## 2019-06-29 MED ORDER — TRAZODONE HCL 50 MG PO TABS
50.0000 mg | ORAL_TABLET | Freq: Every day | ORAL | Status: DC
  Administered 2019-06-29 – 2019-07-01 (×4): 50 mg via ORAL
  Filled 2019-06-29 (×5): qty 1

## 2019-06-29 NOTE — Progress Notes (Signed)
*  PRELIMINARY RESULTS* Echocardiogram 2D Echocardiogram has been performed.  Alan Newman 06/29/2019, 12:07 PM

## 2019-06-29 NOTE — Progress Notes (Addendum)
Inpatient Diabetes Program Recommendations  AACE/ADA: New Consensus Statement on Inpatient Glycemic Control (2015)  Target Ranges:  Prepandial:   less than 140 mg/dL      Peak postprandial:   less than 180 mg/dL (1-2 hours)      Critically ill patients:  140 - 180 mg/dL   Results for GODFREY, TRITSCHLER (MRN [id number]) as of 06/29/2019 07:25  Ref. Range 06/28/2019 22:15  Glucose Latest Ref Range: 70 - 99 mg/dL 474 (H)   Results for EREN, RYSER (MRN [id number]) as of 06/29/2019 07:25  Ref. Range 06/29/2019 06:25  Glucose-Capillary Latest Ref Range: 70 - 99 mg/dL 412 (H)    Admit with: CP  History: DM, CVA, HTN  Home DM Meds:  Metformin 1000 mg BID  Current Orders: Novolog Sensitive Correction Scale/ SSI (0-9 units) Q6 hours     Current A1c pending.  Discharged to [facility] on Levemir and Novolog insulins back in December 2019.  Unsure if patient has been compliant with meds given his Gluocse was 474 mg/dl on admission.  Spoke w/ pt's wife by phone today.  Wife told me pt suffered a stroke and is now under [place] at home. Wife gives pt all meds and checks pt's CBGs.  Only taking Metformin BID at home.  Discussed with wife that pt's Glucose was severely elevated on admission.  Explained to wife that pt is getting insulin in the hospital and that the MD will likely not restart the Metformin until pt able to eat PO diet sufficiently.  Also relayed to wife that current A1c level is pending.  Wife hopeful to get pt home asap.  CBG 412 mg/dl this AM.     MD- Since glucose levels so high on admission,please consider the following:   1. Change Novolog SSi to Q4 hours (currently ordered Q6 hours and Novolog only lasts about 4 hours max)   2. Start basal insulin this AM: Recommend Lantus 6 units Daily (0.1 units/kg)--Please start this AM     --Will follow patient during hospitalization--  Wyn Quaker RN, MSN, CDE Diabetes Coordinator Inpatient Glycemic  Control Team Team Pager: [phone] (8a-5p)

## 2019-06-29 NOTE — Progress Notes (Addendum)
Pt. CBG at 0630 was 412. MD made aware and new orders given to be carried out.

## 2019-06-29 NOTE — H&P (Signed)
[facility] - [place] at [facility]   PATIENT NAME: Alan Newman    MR#:  [id number]  DATE OF BIRTH:  1951-03-17  DATE OF ADMISSION:  06/28/2019  PRIMARY CARE PHYSICIAN: Patient, No Pcp Per   REQUESTING/REFERRING PHYSICIAN: Scotty Court, MD  CHIEF COMPLAINT:   Chief Complaint  Patient presents with  . Chest Pain    HISTORY OF PRESENT ILLNESS:  Dereon Corkery  is a 68 y.o. male who presents with chief complaint as above.  Patient was laying down tonight when he had an episode of chest tightness.  He states that it persisted until EMS got there and gave him sublingual nitro, then it subsided.  He does have a prior history of CAD, stating he had a quadruple bypass some years ago after having a mild heart attack.  Initial work-up here in the ED is largely within normal limits.  Hospitalist were called for admission and further evaluation  PAST MEDICAL HISTORY:   Past Medical History:  Diagnosis Date  . Diabetes mellitus without complication (HCC)   . Hypertension   . Stroke [facility])      PAST SURGICAL HISTORY:   Past Surgical History:  Procedure Laterality Date  . BRAIN SURGERY    . CARDIAC SURGERY       SOCIAL HISTORY:   Social History   Tobacco Use  . Smoking status: Never Smoker  . Smokeless tobacco: Never Used  Substance Use Topics  . Alcohol use: Never    Frequency: Never     FAMILY HISTORY:   Family History  Problem Relation Age of Onset  . Kidney disease Mother   . Dementia Father   . Cancer Father   . Stomach cancer Sister      DRUG ALLERGIES:  No Known Allergies  MEDICATIONS AT HOME:   Prior to Admission medications   Medication Sig Start Date End Date Taking? Authorizing Provider  acetaminophen (TYLENOL) 500 MG tablet Take 1,000 mg by mouth every 8 (eight) hours as needed.    [provider]  amLODipine (NORVASC) 10 MG tablet Take 10 mg by mouth daily.    [provider]  cephALEXin (KEFLEX) 500 MG  capsule Take 1 capsule (500 mg total) by mouth 2 (two) times daily. 11/14/18   Minna Antis, MD  haloperidol lactate (HALDOL) 5 MG/ML injection Inject 0.5 mLs (2.5 mg total) into the vein every 6 (six) hours as needed (Hold if QTc>418ms). Patient not taking: Reported on 11/14/2018 08/31/18   Alford Highland, MD  hydrALAZINE (APRESOLINE) 20 MG/ML injection Inject 0.5 mLs (10 mg total) into the vein every 6 (six) hours as needed (For SBP >150). 08/31/18   Alford Highland, MD  insulin aspart (NOVOLOG) 100 UNIT/ML injection Inject 0-5 Units into the skin at bedtime. 08/31/18   Alford Highland, MD  insulin aspart (NOVOLOG) 100 UNIT/ML injection Inject 0-9 Units into the skin 3 (three) times daily with meals. 08/31/18   Alford Highland, MD  insulin detemir (LEVEMIR) 100 UNIT/ML injection Inject 0.06 mLs (6 Units total) into the skin daily. 08/31/18   Alford Highland, MD  levETIRAcetam (KEPRRA) 500 MG/100ML SOLN Inject 100 mLs (500 mg total) into the vein every 12 (twelve) hours. 08/31/18   Alford Highland, MD  LORazepam (ATIVAN) 2 MG/ML injection Inject 0.5 mLs (1 mg total) into the vein every 4 (four) hours as needed for seizure. 08/31/18   Alford Highland, MD  metoCLOPramide (REGLAN) 5 MG/ML injection Inject 1 mL (5 mg total) into the vein every  6 (six) hours. 08/31/18   Alford HighlandWieting, Richard, MD  metoprolol tartrate (LOPRESSOR) 5 MG/5ML SOLN injection Inject 5 mLs (5 mg total) into the vein every 6 (six) hours as needed. 08/31/18   Alford HighlandWieting, Richard, MD  mupirocin ointment (BACTROBAN) 2 % Place 1 application into the nose 2 (two) times daily. 08/31/18   Alford HighlandWieting, Richard, MD  OLANZapine zydis (ZYPREXA) 5 MG disintegrating tablet Take 0.5 tablets (2.5 mg total) by mouth every 6 (six) hours as needed (for severe agitation or hallucinations). 08/31/18   Alford HighlandWieting, Richard, MD  ondansetron (ZOFRAN) 4 MG/2ML SOLN injection Inject 2 mLs (4 mg total) into the vein every 6 (six) hours as needed for nausea. 08/31/18    Alford HighlandWieting, Richard, MD  pantoprazole (PROTONIX) 40 MG injection Inject 40 mg into the vein every 12 (twelve) hours. 08/31/18   Alford HighlandWieting, Richard, MD  sertraline (ZOLOFT) 100 MG tablet Take 100 mg by mouth at bedtime.    [provider]  sodium chloride 0.9 % infusion Inject 50 mLs into the vein continuous. 08/31/18   Alford HighlandWieting, Richard, MD  tamsulosin (FLOMAX) 0.4 MG CAPS capsule Take 0.4 mg by mouth at bedtime.    [provider]  vancomycin (VANCOCIN) 1-5 GM/200ML-% SOLN Inject 200 mLs (1,000 mg total) into the vein every 18 (eighteen) hours. 08/31/18   Alford HighlandWieting, Richard, MD    REVIEW OF SYSTEMS:  Review of Systems  Constitutional: Negative for chills, fever, malaise/fatigue and weight loss.  HENT: Negative for ear pain, hearing loss and tinnitus.   Eyes: Negative for blurred vision, double vision, pain and redness.  Respiratory: Negative for cough, hemoptysis and shortness of breath.   Cardiovascular: Positive for chest pain. Negative for palpitations, orthopnea and leg swelling.  Gastrointestinal: Negative for abdominal pain, constipation, diarrhea, nausea and vomiting.  Genitourinary: Negative for dysuria, frequency and hematuria.  Musculoskeletal: Negative for back pain, joint pain and neck pain.  Skin:       No acne, rash, or lesions  Neurological: Negative for dizziness, tremors, focal weakness and weakness.  Endo/Heme/Allergies: Negative for polydipsia. Does not bruise/bleed easily.  Psychiatric/Behavioral: Negative for depression. The patient is not nervous/anxious and does not have insomnia.      VITAL SIGNS:   Vitals:   06/28/19 2200 06/28/19 2215 06/28/19 2245 06/28/19 2315  BP: (!) 157/103     Pulse: 77 78 78 80  Resp: 16 19 16  (!) 24  Temp:      TempSrc:      SpO2: 100% 100% 100% 100%  Weight:      Height:       Wt Readings from Last 3 Encounters:  06/28/19 66.4 kg  11/14/18 59 kg  08/27/18 77.9 kg    PHYSICAL EXAMINATION:  Physical Exam  Vitals  reviewed. Constitutional: He is oriented to person, place, and time. He appears well-developed and well-nourished. No distress.  HENT:  Head: Normocephalic and atraumatic.  Mouth/Throat: Oropharynx is clear and moist.  Eyes: Pupils are equal, round, and reactive to light. Conjunctivae and EOM are normal. No scleral icterus.  Neck: Normal range of motion. Neck supple. No JVD present. No thyromegaly present.  Cardiovascular: Normal rate, regular rhythm and intact distal pulses. Exam reveals no gallop and no friction rub.  No murmur heard. Respiratory: Effort normal and breath sounds normal. No respiratory distress. He has no wheezes. He has no rales.  GI: Soft. Bowel sounds are normal. He exhibits no distension. There is no abdominal tenderness.  Musculoskeletal: Normal range of motion.  General: No edema.     Comments: No arthritis, no gout  Lymphadenopathy:    He has no cervical adenopathy.  Neurological: He is alert and oriented to person, place, and time. No cranial nerve deficit.  No dysarthria, no aphasia  Skin: Skin is warm and dry. No rash noted. No erythema.  Psychiatric: He has a normal mood and affect. His behavior is normal. Judgment and thought content normal.    LABORATORY PANEL:   CBC Recent Labs  Lab 06/28/19 2130  WBC 5.7  HGB 12.5*  HCT 40.0  PLT 206   ------------------------------------------------------------------------------------------------------------------  Chemistries  Recent Labs  Lab 06/28/19 2215  NA 133*  K 4.5  CL 95*  CO2 24  GLUCOSE 474*  BUN 18  CREATININE 0.50*  CALCIUM 8.7*  MG 1.6*   ------------------------------------------------------------------------------------------------------------------  Cardiac Enzymes No results for input(s): TROPONINI in the last 168 hours. ------------------------------------------------------------------------------------------------------------------  RADIOLOGY:  Dg Chest Portable 1  View  Result Date: 06/28/2019 CLINICAL DATA:  Chest pain EXAM: PORTABLE CHEST 1 VIEW COMPARISON:  11/14/2018, 08/27/2018 FINDINGS: Post sternotomy changes. Subsegmental atelectasis in the right lower lung. Mild airspace disease at the left base. Posttraumatic and postsurgical deformity of the left thorax with evidence of multiple prior rib fractures. Old mid left clavicle fracture. Stable cardiomediastinal silhouette. IMPRESSION: 1. Patchy opacity at the left base may reflect atelectasis or minimal infiltrate. 2. Subsegmental atelectasis in the right lower lung Electronically Signed   By: Donavan Foil M.D.   On: 06/28/2019 21:57    EKG:   Orders placed or performed during the hospital encounter of 06/28/19  . EKG 12-Lead  . EKG 12-Lead    IMPRESSION AND PLAN:  Principal Problem:   Chest pain -given patient's history and this is high risk chest pain.  His cardiac enzymes are normal.  We will admit him with echocardiogram and cardiology consult Active Problems:   CAD -continue home meds   HTN (hypertension) -home dose antihypertensives   Diabetes ([place]) -sliding scale insulin  Chart review performed and case discussed with ED provider. Labs, imaging and/or ECG reviewed by provider and discussed with patient/family. Management plans discussed with the patient and/or family.  COVID-19 status: Pending  DVT PROPHYLAXIS: SubQ lovenox   GI PROPHYLAXIS:  None  ADMISSION STATUS: Observation  CODE STATUS: Full Code Status History    Date Active Date Inactive Code Status Order ID Comments User Context   08/27/2018 1533 08/31/2018 1544 Full Code [id number]  Henreitta Leber, MD ED   Advance Care Planning Activity      TOTAL TIME TAKING CARE OF THIS PATIENT: 40 minutes.   This patient was evaluated in the context of the global COVID-19 pandemic, which necessitated consideration that the patient might be at risk for infection with the SARS-CoV-2 virus that causes COVID-19. Institutional  protocols and algorithms that pertain to the evaluation of patients at risk for COVID-19 are in a state of rapid change based on information released by regulatory bodies including the CDC and federal and state organizations. These policies and algorithms were followed to the best of this provider's knowledge to date during the patient's care at this facility.  Ethlyn Daniels 06/29/2019, 12:09 AM  [facility]  Office  [phone]  CC: Primary care physician; Patient, No Pcp Per  Note:  This document was prepared using Dragon voice recognition software and may include unintentional dictation errors.

## 2019-06-29 NOTE — Progress Notes (Signed)
The patient has no complaints of chest pain.  Vital signs and labs reviewed. Physical examinations done. Follow-up echo and no further cardiac work-up at this time per Dr. Ubaldo Glassing.  Discussed with the patient, RN, CM and Dr. Ubaldo Glassing.

## 2019-06-29 NOTE — ED Notes (Signed)
ED TO INPATIENT HANDOFF REPORT  ED Nurse Name and Phone #: Roxine Whittinghill [phone]   S Name/Age/Gender Alan Newman 68 y.o. male Room/Bed: ED09A/ED09A  Code Status   Code Status: Prior  Home/SNF/Other Home Patient oriented to: self, place, time and situation Is this baseline? Yes   Triage Complete: Triage complete  Chief Complaint Chest Pain  Triage Note Pt from home with onset of central chest pain and vomiting/nausea one hour pta. Per ems pt with large episode of emesis. Ems gave  asa, one spray ntg, ns bolus,  zofran iv. fsbs 477 per ems. Initial pain 10/10, pt reports pain 5/10 currently. Ems states 91% on ra, placed on 2lpm oxygen.    Allergies No Known Allergies  Level of Care/Admitting Diagnosis ED Disposition    ED Disposition Condition Comment   Admit  Hospital Area: [facility] [[id number]]  Level of Care: Telemetry [5]  Covid Evaluation: Asymptomatic Screening Protocol (No Symptoms)  Diagnosis: Chest pain [409811]  Admitting Physician: Oralia Manis [[id number]]  Attending Physician: Oralia Manis [[id number]]  Bed request comments: 2a  PT Class (Do Not Modify): Observation [104]  PT Acc Code (Do Not Modify): Observation [10022]       B Medical/Surgery History Past Medical History:  Diagnosis Date  . Diabetes mellitus without complication (HCC)   . Hypertension   . Stroke [facility])    Past Surgical History:  Procedure Laterality Date  . BRAIN SURGERY    . CARDIAC SURGERY       A IV Location/Drains/Wounds Patient Lines/Drains/Airways Status   Active Line/Drains/Airways    Name:   Placement date:   Placement time:   Site:   Days:   Peripheral IV 08/31/18 Right Hand   08/31/18    0951    Hand   302   Peripheral IV 06/28/19 Left Wrist   06/28/19    -    Wrist   1   External Urinary Catheter   08/30/18    0726    -   303   Incision (Closed) 08/29/18 Groin Left   08/29/18    2213     304   Wound / Incision (Open or Dehisced)  08/27/18 Non-pressure wound;Other (Comment) Back Left;Mid;Upper   08/27/18    2100    Back   306          Intake/Output Last 24 hours No intake or output data in the 24 hours ending 06/29/19 0039  Labs/Imaging Results for orders placed or performed during the hospital encounter of 06/28/19 (from the past 48 hour(s))  Basic metabolic panel     Status: Abnormal   Collection Time: 06/28/19  9:30 PM  Result Value Ref Range   Sodium 144 135 - 145 mmol/L   Potassium <2.0 (LL) 3.5 - 5.1 mmol/L    Comment: CRITICAL RESULT CALLED TO, READ BACK BY AND VERIFIED WITH ANNIE SMITH ON 06/28/19 AT 2211 [facility]    Chloride 126 (H) 98 - 111 mmol/L   CO2 12 (L) 22 - 32 mmol/L   Glucose, Bld 268 (H) 70 - 99 mg/dL   BUN 9 8 - 23 mg/dL   Creatinine, Ser <6.57 (L) 0.61 - 1.24 mg/dL   Calcium 4.1 (LL) 8.9 - 10.3 mg/dL    Comment: CRITICAL RESULT CALLED TO, READ BACK BY AND VERIFIED WITH ANNIE SMITH ON 06/28/19 AT 2211 [facility]    GFR calc non Af Amer NOT CALCULATED >60 mL/min   GFR calc Af Amer NOT CALCULATED >  60 mL/min   Anion gap 6 5 - 15    Comment: Performed at [facility], [address] [place], [place] [address]  Troponin I (High Sensitivity)     Status: None   Collection Time: 06/28/19  9:30 PM  Result Value Ref Range   Troponin I (High Sensitivity) 2 <18 ng/L    Comment: (NOTE) Elevated high sensitivity troponin I (hsTnI) values and significant  changes across serial measurements may suggest ACS but many other  chronic and acute conditions are known to elevate hsTnI results.  Refer to the "Links" section for chest pain algorithms and additional  guidance. Performed at [facility], [address] [place], [place] [address]   CBC with Differential     Status: Abnormal   Collection Time: 06/28/19  9:30 PM  Result Value Ref Range   WBC 5.7 4.0 - 10.5 K/uL   RBC 4.18 (L) 4.22 - 5.81 MIL/uL   Hemoglobin 12.5 (L) 13.0 - 17.0 g/dL   HCT 21.2 24.8 - 25.0 %   MCV 95.7 80.0 -  100.0 fL   MCH 29.9 26.0 - 34.0 pg   MCHC 31.3 30.0 - 36.0 g/dL   RDW 03.7 04.8 - 88.9 %   Platelets 206 150 - 400 K/uL   nRBC 0.0 0.0 - 0.2 %   Neutrophils Relative % 37 %   Neutro Abs 2.1 1.7 - 7.7 K/uL   Lymphocytes Relative 47 %   Lymphs Abs 2.7 0.7 - 4.0 K/uL   Monocytes Relative 8 %   Monocytes Absolute 0.5 0.1 - 1.0 K/uL   Eosinophils Relative 6 %   Eosinophils Absolute 0.3 0.0 - 0.5 K/uL   Basophils Relative 1 %   Basophils Absolute 0.1 0.0 - 0.1 K/uL   Immature Granulocytes 1 %   Abs Immature Granulocytes 0.04 0.00 - 0.07 K/uL    Comment: Performed at [facility], [address] [place], [place] [address]  SARS Coronavirus 2 [facility] order, Performed in [facility] hospital lab) Nasopharyngeal Nasopharyngeal Swab     Status: None   Collection Time: 06/28/19 10:01 PM   Specimen: Nasopharyngeal Swab  Result Value Ref Range   SARS Coronavirus 2 NEGATIVE NEGATIVE    Comment: (NOTE) If result is NEGATIVE SARS-CoV-2 target nucleic acids are NOT DETECTED. The SARS-CoV-2 RNA is generally detectable in upper and lower  respiratory specimens during the acute phase of infection. The lowest  concentration of SARS-CoV-2 viral copies this assay can detect is 250  copies / mL. A negative result does not preclude SARS-CoV-2 infection  and should not be used as the sole basis for treatment or other  patient management decisions.  A negative result may occur with  improper specimen collection / handling, submission of specimen other  than nasopharyngeal swab, presence of viral mutation(s) within the  areas targeted by this assay, and inadequate number of viral copies  (<250 copies / mL). A negative result must be combined with clinical  observations, patient history, and epidemiological information. If result is POSITIVE SARS-CoV-2 target nucleic acids are DETECTED. The SARS-CoV-2 RNA is generally detectable in upper and lower  respiratory specimens dur ing the acute  phase of infection.  Positive  results are indicative of active infection with SARS-CoV-2.  Clinical  correlation with patient history and other diagnostic information is  necessary to determine patient infection status.  Positive results do  not rule out bacterial infection or co-infection with other viruses. If result is PRESUMPTIVE POSTIVE  SARS-CoV-2 nucleic acids MAY BE PRESENT.   A presumptive positive result was obtained on the submitted specimen  and confirmed on repeat testing.  While 2019 novel coronavirus  (SARS-CoV-2) nucleic acids may be present in the submitted sample  additional confirmatory testing may be necessary for epidemiological  and / or clinical management purposes  to differentiate between  SARS-CoV-2 and other Sarbecovirus currently known to infect humans.  If clinically indicated additional testing with an alternate test  methodology [phone]) is advised. The SARS-CoV-2 RNA is generally  detectable in upper and lower respiratory sp ecimens during the acute  phase of infection. The expected result is Negative. Fact Sheet for Patients:  StrictlyIdeas.no Fact Sheet for Healthcare Providers: BankingDealers.co.za This test is not yet approved or cleared by the [place] FDA and has been authorized for detection and/or diagnosis of SARS-CoV-2 by FDA under an Emergency Use Authorization (EUA).  This EUA will remain in effect (meaning this test can be used) for the duration of the COVID-19 declaration under Section 564(b)(1) of the Act, 21 U.S.C. section 360bbb-3(b)(1), unless the authorization is terminated or revoked sooner. Performed at [facility], [address] [place], [place] [address]   Basic metabolic panel     Status: Abnormal   Collection Time: 06/28/19 10:15 PM  Result Value Ref Range   Sodium 133 (L) 135 - 145 mmol/L   Potassium 4.5 3.5 - 5.1 mmol/L   Chloride 95 (L) 98 - 111 mmol/L    CO2 24 22 - 32 mmol/L   Glucose, Bld 474 (H) 70 - 99 mg/dL   BUN 18 8 - 23 mg/dL   Creatinine, Ser 0.50 (L) 0.61 - 1.24 mg/dL   Calcium 8.7 (L) 8.9 - 10.3 mg/dL   GFR calc non Af Amer >60 >60 mL/min   GFR calc Af Amer >60 >60 mL/min   Anion gap 14 5 - 15    Comment: Performed at [facility], [address] [place], [place] [address]  Magnesium     Status: Abnormal   Collection Time: 06/28/19 10:15 PM  Result Value Ref Range   Magnesium 1.6 (L) 1.7 - 2.4 mg/dL    Comment: Performed at [facility], [address], [place], [place] [address]  Phosphorus     Status: None   Collection Time: 06/28/19 10:15 PM  Result Value Ref Range   Phosphorus 3.4 2.5 - 4.6 mg/dL    Comment: Performed at [facility], [place], [place] [address]  Troponin I (High Sensitivity)     Status: None   Collection Time: 06/28/19 11:58 PM  Result Value Ref Range   Troponin I (High Sensitivity) 5 <18 ng/L    Comment: (NOTE) Elevated high sensitivity troponin I (hsTnI) values and significant  changes across serial measurements may suggest ACS but many other  chronic and acute conditions are known to elevate hsTnI results.  Refer to the "Links" section for chest pain algorithms and additional  guidance. Performed at [facility], [address] [place],  [address]    Dg Chest Portable 1 View  Result Date: 06/28/2019 CLINICAL DATA:  Chest pain EXAM: PORTABLE CHEST 1 VIEW COMPARISON:  11/14/2018, 08/27/2018 FINDINGS: Post sternotomy changes. Subsegmental atelectasis in the right lower lung. Mild airspace disease at the left base. Posttraumatic and postsurgical deformity of the left thorax with evidence of multiple prior rib fractures. Old mid left clavicle fracture. Stable cardiomediastinal silhouette. IMPRESSION: 1. Patchy opacity at the left base  may reflect atelectasis or minimal infiltrate. 2. Subsegmental atelectasis in the right lower lung  Electronically Signed   By: Jasmine PangKim  Fujinaga M.D.   On: 06/28/2019 21:57    Pending Labs [employer] CorporationUnresulted Labs (From admission, onward)    Start     Ordered   Signed and Held  Hemoglobin A1c  Once,   R    Comments: To assess prior glycemic control    Signed and Held   Signed and Held  CBC  (enoxaparin (LOVENOX)    CrCl >/= 30 ml/min)  Once,   R    Comments: Baseline for enoxaparin therapy IF NOT ALREADY DRAWN.  Notify MD if PLT < 100 K.    Signed and Held   Signed and Held  Creatinine, serum  (enoxaparin (LOVENOX)    CrCl >/= 30 ml/min)  Once,   R    Comments: Baseline for enoxaparin therapy IF NOT ALREADY DRAWN.    Signed and Held   Signed and Held  Creatinine, serum  (enoxaparin (LOVENOX)    CrCl >/= 30 ml/min)  Weekly,   R    Comments: while on enoxaparin therapy    Signed and Held   Signed and Held  Basic metabolic panel  Tomorrow morning,   R     Signed and Held   Signed and Held  CBC  Tomorrow morning,   R     Signed and Held          Vitals/Pain Today's Vitals   06/28/19 2200 06/28/19 2215 06/28/19 2245 06/28/19 2315  BP: (!) 157/103     Pulse: 77 78 78 80  Resp: 16 19 16  (!) 24  Temp:      TempSrc:      SpO2: 100% 100% 100% 100%  Weight:      Height:      PainSc:        Isolation Precautions No active isolations  Medications Medications - No data to display  Mobility non-ambulatory Low fall risk   Focused Assessments Cardiac Assessment Handoff:    Lab Results  Component Value Date   TROPONINI <0.03 11/14/2018   No results found for: DDIMER Does the Patient currently have chest pain? Yes     R Recommendations: See Admitting Provider Note  Report given to:   Additional Notes:

## 2019-06-29 NOTE — Plan of Care (Signed)

## 2019-06-29 NOTE — Consult Note (Signed)
[facility]  CARDIOLOGY CONSULT NOTE  Patient ID: Alan ShellingWilliam L Newman MRN: [id number] DOB/AGE: 66/02/1951 66 y.o.  Admit date: 06/28/2019 Referring Physician Dr. Oralia Manisavid Willis  Primary Physician [facility] Primary Cardiologist N/A Reason for Consultation Chest pain   HPI: Ms. Alan JunesMerritt is a 68 year old male with a past medical history significant for coronary artery disease s/p CABG in 2007 with a LIMA to the distal LAD, SVG to D1, SVG to OM1, and SVG to PDA, type 2 diabetes, history of CVA, and hypertension who presented to the ED on 06/28/19 for acute onset of chest pain.  Pain was described as centralized, chest pressure with associated shortness of breath.  Reports that he ate a Whopper prior to falling asleep with pain similar to indigestion symptoms he's had previously.  Workup in the ED included ECG which was negative for acute ischemic changes, troponin negative x 2, 5, 5 respectively, chest xray revealing an opacity in the left lung base, likely atelectasis or a minimal infiltrate and subsegmental atelectasis in the right lower lung.  Glucose elevated at 412.    Currently denies any chest pain, shortness of breath, palpitations, lower extremity swelling, orthopnea, or PND.  He is not followed by a cardiologist in an outpatient setting.  Per patient, [facility] was normal.    Review of systems complete and found to be negative unless listed above     Past Medical History:  Diagnosis Date  . Diabetes mellitus without complication (HCC)   . Hypertension   . Stroke [facility](HCC)     Past Surgical History:  Procedure Laterality Date  . BRAIN SURGERY    . CARDIAC SURGERY      Medications Prior to Admission  Medication Sig Dispense Refill Last Dose  . hyoscyamine (LEVSIN) 0.125 MG tablet Take 0.125 mg by mouth every 6 (six) hours as needed for cramping.   prn at prn  . lisinopril (ZESTRIL) 10 MG tablet Take 10 mg by mouth 2 (two) times daily.   Past Week at Unknown  time  . LORazepam (ATIVAN) 0.5 MG tablet Take 0.5 mg by mouth every 4 (four) hours as needed for anxiety.   prn at prn  . metFORMIN (GLUCOPHAGE) 1000 MG tablet Take 1,000 mg by mouth 2 (two) times daily with a meal.   Past Week at Unknown time  . metoprolol succinate (TOPROL-XL) 100 MG 24 hr tablet Take 50-100 mg by mouth 2 (two) times daily. Take 100mg  in the morning and 50mg  in the evening Take with or immediately following a meal.   Past Week at Unknown time  . ondansetron (ZOFRAN) 4 MG tablet Take 4 mg by mouth every 6 (six) hours as needed for nausea or vomiting.   prn at prn  . ondansetron (ZOFRAN-ODT) 4 MG disintegrating tablet Take 4 mg by mouth every 6 (six) hours as needed for nausea or vomiting.   prn at prn  . tamsulosin (FLOMAX) 0.4 MG CAPS capsule Take 0.4 mg by mouth at bedtime.   Past Week at Unknown time  . traZODone (DESYREL) 50 MG tablet Take 50 mg by mouth at bedtime.   Past Week at Unknown time   Social History   Socioeconomic History  . Marital status: Married    Spouse name: Not on file  . Number of children: Not on file  . Years of education: Not on file  . Highest education level: Not on file  Occupational History  . Not on  file  Social Needs  . Financial resource strain: Not on file  . Food insecurity    Worry: Not on file    Inability: Not on file  . Transportation needs    Medical: Not on file    Non-medical: Not on file  Tobacco Use  . Smoking status: Never Smoker  . Smokeless tobacco: Never Used  Substance and Sexual Activity  . Alcohol use: Never    Frequency: Never  . Drug use: Never  . Sexual activity: Not on file  Lifestyle  . Physical activity    Days per week: Not on file    Minutes per session: Not on file  . Stress: Not on file  Relationships  . Social Musician on phone: Not on file    Gets together: Not on file    Attends religious service: Not on file    Active member of club or organization: Not on file    Attends  meetings of clubs or organizations: Not on file    Relationship status: Not on file  . Intimate partner violence    Fear of current or ex partner: Not on file    Emotionally abused: Not on file    Physically abused: Not on file    Forced sexual activity: Not on file  Other Topics Concern  . Not on file  Social History Narrative  . Not on file    Family History  Problem Relation Age of Onset  . Kidney disease Mother   . Dementia Father   . Cancer Father   . Stomach cancer Sister       Review of systems complete and found to be negative unless listed above      PHYSICAL EXAM  General: Well developed, well nourished, in no acute distress HEENT:  Normocephalic and atramatic Neck:  No JVD.  Lungs: Clear bilaterally to auscultation and percussion. Heart: HRRR . Normal S1 and S2 without gallops or murmurs.  Abdomen: Bowel sounds are positive, abdomen soft and non-tender  Msk:  Back normal. Normal strength and tone for age. Extremities: No clubbing, cyanosis or edema.   Neuro: Alert and oriented X 3. Psych:  Good affect, responds appropriately  Labs:   Lab Results  Component Value Date   WBC 5.7 06/29/2019   HGB 14.0 06/29/2019   HCT 41.7 06/29/2019   MCV 90.7 06/29/2019   PLT 233 06/29/2019    Recent Labs  Lab 06/29/19 0628  NA 135  K 4.4  CL 94*  CO2 29  BUN 17  CREATININE 0.61  CALCIUM 9.0  GLUCOSE 439*   Lab Results  Component Value Date   TROPONINI <0.03 11/14/2018   No results found for: CHOL No results found for: HDL No results found for: LDLCALC No results found for: TRIG No results found for: CHOLHDL No results found for: LDLDIRECT    Radiology: Dg Chest Portable 1 View  Result Date: 06/28/2019 CLINICAL DATA:  Chest pain EXAM: PORTABLE CHEST 1 VIEW COMPARISON:  11/14/2018, 08/27/2018 FINDINGS: Post sternotomy changes. Subsegmental atelectasis in the right lower lung. Mild airspace disease at the left base. Posttraumatic and postsurgical  deformity of the left thorax with evidence of multiple prior rib fractures. Old mid left clavicle fracture. Stable cardiomediastinal silhouette. IMPRESSION: 1. Patchy opacity at the left base may reflect atelectasis or minimal infiltrate. 2. Subsegmental atelectasis in the right lower lung Electronically Signed   By: Jasmine Pang M.D.   On: 06/28/2019 21:57  EKG: Normal sinus rhythm with non specific T wave abnormalities   ASSESSMENT AND PLAN:  1.  Chest pain   -Likely due to indigestion; patient currently chest pain free  -Troponin negative x 2 (5, 5), ECG without significant ischemic changes; will trend third troponin; okay to obtain echocardiogram, but no other cardiac workup indicated at this time   -Encourage to establish care with cardiologist following discharge  2.  Type 2 Diabetes   -Glucose elevated at 439; sliding scale insulin recommended  The history, physical exam findings, and plan of care were all discussed with Dr. Bartholome Bill, and all decision making was made in collaboration.   Signed: Avie Arenas PA-C 06/29/2019, 8:43 AM

## 2019-06-29 NOTE — Progress Notes (Signed)
Advanced Care Plan.  Purpose of Encounter: CODE STATUS. Parties in Attendance: The patient and me. Patient's Decisional Capacity: Yes. Medical Story: Alan Newman  is a 68 y.o. male with history of CAD, hypertension, diabetes and stroke.  The patient is admitted for chest pain.  I discussed with the patient about his current condition and prognosis.  The patient does want to be resuscitated and intubated if he has cardiopulmonary arrest.  Later, according to social worker, the patient is in hospice care at home.  I requestrf palliative care consult. Goals of Care Determinations: Hospice care. Plan:  Code Status: Full code. Time spent discussing advance care planning: 18 minutes.

## 2019-06-30 ENCOUNTER — Inpatient Hospital Stay: Payer: Medicare Other

## 2019-06-30 ENCOUNTER — Encounter: Payer: Self-pay | Admitting: Primary Care

## 2019-06-30 DIAGNOSIS — R079 Chest pain, unspecified: Principal | ICD-10-CM

## 2019-06-30 DIAGNOSIS — Z515 Encounter for palliative care: Secondary | ICD-10-CM

## 2019-06-30 DIAGNOSIS — A419 Sepsis, unspecified organism: Secondary | ICD-10-CM

## 2019-06-30 DIAGNOSIS — R6521 Severe sepsis with septic shock: Secondary | ICD-10-CM

## 2019-06-30 DIAGNOSIS — Z7189 Other specified counseling: Secondary | ICD-10-CM

## 2019-06-30 LAB — URINALYSIS, COMPLETE (UACMP) WITH MICROSCOPIC
Bilirubin Urine: NEGATIVE
Glucose, UA: >=500 mg/dL — AB
Ketones, ur: NEGATIVE mg/dL
Nitrite: POSITIVE — AB
Protein, ur: 30 mg/dL — AB
Specific Gravity, Urine: 1.023 (ref 1.005–1.030)
WBC, UA: >50 WBC/hpf — ABNORMAL HIGH (ref 0–5)
pH: 5 (ref 5.0–8.0)

## 2019-06-30 LAB — CBC WITH DIFFERENTIAL/PLATELET
Abs Immature Granulocytes: 0.02 10*3/uL (ref 0.00–0.07)
Basophils Absolute: 0.1 10*3/uL (ref 0.0–0.1)
Basophils Relative: 2 %
Eosinophils Absolute: 0.5 10*3/uL (ref 0.0–0.5)
Eosinophils Relative: 10 %
HCT: 35.5 % — ABNORMAL LOW (ref 39.0–52.0)
Hemoglobin: 11.6 g/dL — ABNORMAL LOW (ref 13.0–17.0)
Immature Granulocytes: 0 %
Lymphocytes Relative: 47 %
Lymphs Abs: 2.2 10*3/uL (ref 0.7–4.0)
MCH: 29.9 pg (ref 26.0–34.0)
MCHC: 32.7 g/dL (ref 30.0–36.0)
MCV: 91.5 fL (ref 80.0–100.0)
Monocytes Absolute: 0.4 10*3/uL (ref 0.1–1.0)
Monocytes Relative: 9 %
Neutro Abs: 1.5 10*3/uL — ABNORMAL LOW (ref 1.7–7.7)
Neutrophils Relative %: 32 %
Platelets: 234 10*3/uL (ref 150–400)
RBC: 3.88 MIL/uL — ABNORMAL LOW (ref 4.22–5.81)
RDW: 13.9 % (ref 11.5–15.5)
WBC: 4.7 10*3/uL (ref 4.0–10.5)
nRBC: 0 % (ref 0.0–0.2)

## 2019-06-30 LAB — COMPREHENSIVE METABOLIC PANEL
ALT: 9 U/L (ref 0–44)
AST: 11 U/L — ABNORMAL LOW (ref 15–41)
Albumin: 2.8 g/dL — ABNORMAL LOW (ref 3.5–5.0)
Alkaline Phosphatase: 77 U/L (ref 38–126)
Anion gap: 9 (ref 5–15)
BUN: 18 mg/dL (ref 8–23)
CO2: 27 mmol/L (ref 22–32)
Calcium: 8.4 mg/dL — ABNORMAL LOW (ref 8.9–10.3)
Chloride: 98 mmol/L (ref 98–111)
Creatinine, Ser: 0.47 mg/dL — ABNORMAL LOW (ref 0.61–1.24)
GFR calc Af Amer: >60 mL/min (ref >60)
GFR calc non Af Amer: >60 mL/min (ref >60)
Glucose, Bld: 359 mg/dL — ABNORMAL HIGH (ref 70–99)
Potassium: 4.3 mmol/L (ref 3.5–5.1)
Sodium: 134 mmol/L — ABNORMAL LOW (ref 135–145)
Total Bilirubin: 0.3 mg/dL (ref 0.3–1.2)
Total Protein: 5.4 g/dL — ABNORMAL LOW (ref 6.5–8.1)

## 2019-06-30 LAB — MRSA PCR SCREENING: MRSA by PCR: POSITIVE — AB

## 2019-06-30 LAB — GLUCOSE, CAPILLARY
Glucose-Capillary: 301 mg/dL — ABNORMAL HIGH (ref 70–99)
Glucose-Capillary: 317 mg/dL — ABNORMAL HIGH (ref 70–99)
Glucose-Capillary: 351 mg/dL — ABNORMAL HIGH (ref 70–99)
Glucose-Capillary: 334 mg/dL — ABNORMAL HIGH (ref 70–99)
Glucose-Capillary: 303 mg/dL — ABNORMAL HIGH (ref 70–99)

## 2019-06-30 LAB — APTT: aPTT: 26 seconds (ref 24–36)

## 2019-06-30 LAB — HEMOGLOBIN A1C
Hgb A1c MFr Bld: 12.9 % — ABNORMAL HIGH (ref 4.8–5.6)
Hgb A1c MFr Bld: 12.9 % — ABNORMAL HIGH (ref 4.8–5.6)
Mean Plasma Glucose: 323.53 mg/dL
Mean Plasma Glucose: 323.53 mg/dL

## 2019-06-30 LAB — PROTIME-INR
INR: 1.1 (ref 0.8–1.2)
Prothrombin Time: 13.7 seconds (ref 11.4–15.2)

## 2019-06-30 LAB — PROCALCITONIN: Procalcitonin: <0.1 ng/mL

## 2019-06-30 LAB — LACTIC ACID, PLASMA
Lactic Acid, Venous: 2.2 mmol/L (ref 0.5–1.9)
Lactic Acid, Venous: 2 mmol/L (ref 0.5–1.9)

## 2019-06-30 MED ORDER — SODIUM CHLORIDE 0.9 % IV SOLN
1.0000 g | INTRAVENOUS | Status: DC
  Administered 2019-06-30 – 2019-07-02 (×2): 1 g via INTRAVENOUS
  Filled 2019-06-30: qty 10
  Filled 2019-06-30: qty 1
  Filled 2019-06-30 (×2): qty 10

## 2019-06-30 MED ORDER — ATORVASTATIN CALCIUM 40 MG PO TABS: 40.0000 mg | ORAL_TABLET | Freq: Every day | ORAL | 1 refills | Status: DC

## 2019-06-30 MED ORDER — SODIUM CHLORIDE 0.9 % IV BOLUS (SEPSIS)
500.0000 mL | Freq: Once | INTRAVENOUS | Status: AC
  Administered 2019-06-30: 500 mL via INTRAVENOUS

## 2019-06-30 MED ORDER — NOREPINEPHRINE 4 MG/250ML-% IV SOLN: 2.0000 ug/min | INTRAVENOUS | Status: DC

## 2019-06-30 MED ORDER — SODIUM CHLORIDE 0.9 % IV SOLN: INTRAVENOUS | Status: DC

## 2019-06-30 MED ORDER — INSULIN ASPART 100 UNIT/ML ~~LOC~~ SOLN
0.0000 [IU] | Freq: Three times a day (TID) | SUBCUTANEOUS | Status: DC
  Administered 2019-06-30: 4 [IU] via SUBCUTANEOUS
  Administered 2019-06-30: 15 [IU] via SUBCUTANEOUS
  Filled 2019-06-30 (×2): qty 1

## 2019-06-30 MED ORDER — INSULIN ASPART 100 UNIT/ML ~~LOC~~ SOLN
0.0000 [IU] | SUBCUTANEOUS | Status: DC
  Administered 2019-06-30 (×2): 11 [IU] via SUBCUTANEOUS
  Administered 2019-07-01: 8 [IU] via SUBCUTANEOUS
  Administered 2019-07-01: 2 [IU] via SUBCUTANEOUS
  Administered 2019-07-01: 3 [IU] via SUBCUTANEOUS
  Administered 2019-07-01: 5 [IU] via SUBCUTANEOUS
  Administered 2019-07-01: 3 [IU] via SUBCUTANEOUS
  Administered 2019-07-02: 15:00:00 5 [IU] via SUBCUTANEOUS
  Administered 2019-07-02: 09:00:00 3 [IU] via SUBCUTANEOUS
  Administered 2019-07-02 (×2): 5 [IU] via SUBCUTANEOUS
  Filled 2019-06-30 (×11): qty 1

## 2019-06-30 MED ORDER — ASPIRIN 81 MG PO CHEW: 81.0000 mg | CHEWABLE_TABLET | Freq: Every day | ORAL | 2 refills | Status: DC

## 2019-06-30 MED ORDER — SODIUM CHLORIDE 0.9 % IV SOLN
250.0000 mL | INTRAVENOUS | Status: DC
  Administered 2019-07-02: 250 mL via INTRAVENOUS

## 2019-06-30 MED ORDER — CHLORHEXIDINE GLUCONATE CLOTH 2 % EX PADS
6.0000 | MEDICATED_PAD | Freq: Every day | CUTANEOUS | Status: DC
  Administered 2019-06-30: 6 via TOPICAL

## 2019-06-30 MED ORDER — INSULIN ASPART 100 UNIT/ML ~~LOC~~ SOLN: 0.0000 [IU] | Freq: Every day | SUBCUTANEOUS | Status: DC

## 2019-06-30 MED ORDER — MAGNESIUM SULFATE 2 GM/50ML IV SOLN
2.0000 g | Freq: Once | INTRAVENOUS | Status: AC
  Administered 2019-06-30: 2 g via INTRAVENOUS
  Filled 2019-06-30: qty 50

## 2019-06-30 MED ORDER — SODIUM CHLORIDE 0.9 % IV BOLUS
500.0000 mL | Freq: Once | INTRAVENOUS | Status: AC
  Administered 2019-06-30: 500 mL via INTRAVENOUS

## 2019-06-30 MED ORDER — INSULIN ASPART 100 UNIT/ML ~~LOC~~ SOLN: 0.0000 [IU] | Freq: Three times a day (TID) | SUBCUTANEOUS | Status: DC

## 2019-06-30 MED ORDER — LACTATED RINGERS IV SOLN: INTRAVENOUS | Status: DC

## 2019-06-30 MED ORDER — SODIUM CHLORIDE 0.9 % IV SOLN
INTRAVENOUS | Status: DC
  Administered 2019-06-30: 19:00:00 via INTRAVENOUS

## 2019-06-30 MED ORDER — NOREPINEPHRINE 4 MG/250ML-% IV SOLN: 0.0000 ug/min | INTRAVENOUS | Status: DC

## 2019-06-30 MED ORDER — INSULIN GLARGINE 100 UNIT/ML ~~LOC~~ SOLN
15.0000 [IU] | Freq: Every day | SUBCUTANEOUS | Status: DC
  Administered 2019-06-30 – 2019-07-01 (×2): 15 [IU] via SUBCUTANEOUS
  Filled 2019-06-30 (×5): qty 0.15

## 2019-06-30 MED ORDER — ASPIRIN 81 MG PO CHEW
81.0000 mg | CHEWABLE_TABLET | Freq: Every day | ORAL | Status: DC
  Administered 2019-06-30 – 2019-07-02 (×3): 81 mg via ORAL
  Filled 2019-06-30 (×3): qty 1

## 2019-06-30 MED ORDER — SODIUM CHLORIDE 0.9 % IV BOLUS
1000.0000 mL | Freq: Once | INTRAVENOUS | Status: AC
  Administered 2019-06-30: 1000 mL via INTRAVENOUS

## 2019-06-30 NOTE — Progress Notes (Signed)
Initial Nutrition Assessment  DOCUMENTATION CODES:   Not applicable  INTERVENTION:   Recommend Ensure Max protein supplement BID, each supplement provides 150kcal and 30g of protein.  Recommend MVI daily   Recommend referral to [facility] education center for outpatient diabetes counseling.   NUTRITION DIAGNOSIS:   Increased nutrient needs related to chronic illness(heart disease) as evidenced by increased estimated needs.  GOAL:   Patient will meet greater than or equal to 90% of their needs  MONITOR:   PO intake, Supplement acceptance, Labs, Weight trends, Skin, I & O's  REASON FOR ASSESSMENT:   Consult Assessment of nutrition requirement/status  ASSESSMENT:   68 year old male with a past medical history significant for coronary artery disease s/p CABG in 2007 with a LIMA to the distal LAD, SVG to D1, SVG to OM1, and SVG to PDA, type 2 diabetes, history of CVA, and hypertension who presented to the ED on 06/28/19 for acute onset of chest pain.   Spoke with pt via phone today. Pt reports good appetite and oral intake at baseline. Pt reports that he is eating 100% of meals in hospital. Per chart, pt has lost ~46lbs(27%) over the past year but patient denies any weight loss. RD unsure if admit weights are correct at they have varied quite a bit from day to day. Pt documented to weigh 171lbs in December. Recommend supplements and MVI to help pt meet his estimated needs. Pt to possibly discharge today.   Pt with elevated blood glucoses and AIC; would recommend referral to [facility] education center for outpatient diabetes counseling.   Medications reviewed and include: aspirin, lovenox, insulin, Mg sulfate   Labs reviewed: cbgs- 301, 317, 351 x 24 hrs AIC 12.9(H)- 10/6  Unable to complete Nutrition-Focused physical exam at this time.   Diet Order:   Diet Order            Diet - low sodium heart healthy        Diet heart healthy/carb modified Room service appropriate? Yes; Fluid  consistency: Thin  Diet effective now             EDUCATION NEEDS:   Education needs have been addressed  Skin:  Skin Assessment: Reviewed RN Assessment(Ecchymosis)  Last BM:  10/4  Height:   Ht Readings from Last 1 Encounters:  06/29/19 5\' 9"  (1.753 m)    Weight:   Wt Readings from Last 1 Encounters:  06/30/19 56.7 kg    Ideal Body Weight:  72.7 kg  BMI:  Body mass index is 18.47 kg/m.  Estimated Nutritional Needs:   Kcal:  1700-2000kcal/day  Protein:  85-100g/day  Fluid:  >1.4L/day  Koleen Distance MS, RD, LDN Pager #- [phone] Office#- [phone] After Hours Pager: [phone]

## 2019-06-30 NOTE — Progress Notes (Addendum)
1318--BP 82/60 despite increasing PO fluid intake.  MD contacted and received order for NS 539mL IV bolus.  1409--BP 78/58 despite IV bolus.  MD contacted and another NS 587mL IV bolus ordered.  1600--Persistent low BP at 74/58.  MD contacted and discussed UA findings.  Code Sepsis called.  1700--BC x2, lactic acid drawn by lab. Another NS 599mL IV bolus administered. Rocephin IV initiated.  Pt transferred to ICU 13.  Report given to Doristine Counter, RN.  Called pt's wife, [name], but there was no answer.

## 2019-06-30 NOTE — Progress Notes (Addendum)
[place] [facility] [place] NAME: Alan Newman    MR#:  [id number]  DATE OF BIRTH:  [birth year]  SUBJECTIVE:  CHIEF COMPLAINT:   Chief Complaint  Patient presents with  . Chest Pain   The patient has no complaints.  The patient was given normal saline bolus due to hypotension last night. REVIEW OF SYSTEMS:  Review of Systems  Constitutional: Negative for chills, fever and malaise/fatigue.  HENT: Negative for sore throat.   Eyes: Negative for blurred vision and double vision.  Respiratory: Negative for cough, hemoptysis, shortness of breath, wheezing and stridor.   Cardiovascular: Negative for chest pain, palpitations, orthopnea and leg swelling.  Gastrointestinal: Negative for abdominal pain, blood in stool, diarrhea, melena, nausea and vomiting.  Genitourinary: Negative for dysuria, flank pain and hematuria.  Musculoskeletal: Negative for back pain and joint pain.  Neurological: Negative for dizziness, sensory change, focal weakness, seizures, loss of consciousness, weakness and headaches.  Endo/Heme/Allergies: Negative for polydipsia.  Psychiatric/Behavioral: Negative for depression. The patient is not nervous/anxious.     DRUG ALLERGIES:  No Known Allergies VITALS:  Blood pressure (!) 76/60, pulse 67, temperature 97.7 F (36.5 C), resp. rate 18, height 5\' 9"  (1.753 m), weight 56.7 kg, SpO2 94 %. PHYSICAL EXAMINATION:  Physical Exam Constitutional:      General: He is not in acute distress. HENT:     Head: Normocephalic.  Eyes:     General: No scleral icterus.    Conjunctiva/sclera: Conjunctivae normal.     Pupils: Pupils are equal, round, and reactive to light.  Neck:     Musculoskeletal: Normal range of motion and neck supple.     Vascular: No JVD.     Trachea: No tracheal deviation.  Cardiovascular:     Rate and Rhythm: Normal rate and regular rhythm.     Heart sounds: Normal heart sounds. No murmur. No gallop.    Pulmonary:     Effort: Pulmonary effort is normal. No respiratory distress.     Breath sounds: Normal breath sounds. No wheezing or rales.  Abdominal:     General: Bowel sounds are normal. There is no distension.     Palpations: Abdomen is soft.     Tenderness: There is no abdominal tenderness. There is no rebound.  Musculoskeletal: Normal range of motion.        General: No tenderness.     Right lower leg: No edema.     Left lower leg: No edema.     Comments: muscle atrophy.  Skin:    Findings: No erythema or rash.  Neurological:     Mental Status: He is alert and oriented to person, place, and time.     Cranial Nerves: No cranial nerve deficit.    LABORATORY PANEL:  Male CBC Recent Labs  Lab 06/29/19 0628  WBC 5.7  HGB 14.0  HCT 41.7  PLT 233   ------------------------------------------------------------------------------------------------------------------ Chemistries  Recent Labs  Lab 06/28/19 2215  06/29/19 0628  NA 133*  --  135  K 4.5  --  4.4  CL 95*  --  94*  CO2 24  --  29  GLUCOSE 474*  --  439*  BUN 18  --  17  CREATININE 0.50*   < > 0.61  CALCIUM 8.7*  --  9.0  MG 1.6*  --   --    < > = values in this interval not displayed.   RADIOLOGY:  No results found. ASSESSMENT AND  PLAN:   Chest pain  His cardiac enzymes are normal.echocardiography is unremarkable.  For cardiology consult, no other cardiac workup indicated at this time    CAD -continue home meds   HTN (hypertension) -hold hypertension medication Lopressor and lisinopril due to hypotension.  Hypotension.  Blood pressure is low again, 76/60, normal saline bolus.   Diabetes (HCC) -increase to moderate sliding scale insulin, added Lantus 15 units daily. Hypomagnesemia.  IV magnesium and follow-up level. Generalized weakness.  PT evaluation suggest skilled nursing facility placement. All the records are reviewed and case discussed with Care Management/Social Worker. Management plans  discussed with the patient, family and they are in agreement.  CODE STATUS: Full Code  TOTAL TIME TAKING CARE OF THIS PATIENT: 32 minutes.   More than 50% of the time was spent in counseling/coordination of care: YES  POSSIBLE D/C IN 2 DAYS, DEPENDING ON CLINICAL CONDITION.   Shaune Pollack M.D on 06/30/2019 at 12:51 PM  Between 7am to 6pm - Pager - [phone]  After 6pm go to [email or web address] - Therapist, nutritional Hospitalists

## 2019-06-30 NOTE — Progress Notes (Signed)
Patient complaining of new onset dizziness. Dr. Mortimer Fries notified and CT head ordered. All VSS Lupita Leash

## 2019-06-30 NOTE — Progress Notes (Signed)
Spoke with patient's wife and updated her on transfer and patient's condition. Lupita Leash

## 2019-06-30 NOTE — Consult Note (Addendum)
Name: Alan Newman MRN: [id number] DOB: 03/04/1951     CONSULTATION DATE: 06/30/2019  REFERRING MD :  Dr. Imogene Burnhen  CHIEF COMPLAINT:  Septic shock  STUDIES:  10/4 Patchy opacity at left base atelectasis vs infiltrate; atelectasis in right lower lung 10/5 Echo - EF 50-55%, mild left ventricular hypertrophy 10/6 UA significant for pyuria, presence of bacteria and >500 glucose; culture pending 10/6 Transfer to ICU with concern for septic shock  HISTORY OF PRESENT ILLNESS:   Alan Newman is a 68 yo male with past medical history of type 2 diabetes mellitus, CAD with quadruple bypass, hypertension, and stroke presents to ICU from the floor with hypotension and concern for septic shock. Originally, pt presented to ED on 10/4 complaining of chest pain relieved with sublingual nitroglycerine. EKG showed no evidence of acute infarct, cardiac enzymes were within normal limits, and findings were not concerning for cardiac ischemia.  Subsequently had very low BP in the 70's Patient was started on IVF's 2 L NS Upon arrival to ICU BP 89/68 alert and awake  PAST MEDICAL HISTORY :   has a past medical history of Diabetes mellitus without complication (HCC), Hypertension, and Stroke (HCC).  has a past surgical history that includes Cardiac surgery and Brain surgery. Prior to Admission medications   Medication Sig Start Date End Date Taking? Authorizing Provider  hyoscyamine (LEVSIN) 0.125 MG tablet Take 0.125 mg by mouth every 6 (six) hours as needed for cramping.   Yes [provider]  lisinopril (ZESTRIL) 10 MG tablet Take 10 mg by mouth 2 (two) times daily.   Yes [provider]  LORazepam (ATIVAN) 0.5 MG tablet Take 0.5 mg by mouth every 4 (four) hours as needed for anxiety.   Yes [provider]  metFORMIN (GLUCOPHAGE) 1000 MG tablet Take 1,000 mg by mouth 2 (two) times daily with a meal.   Yes [provider]  metoprolol succinate (TOPROL-XL) 100 MG 24  hr tablet Take 50-100 mg by mouth 2 (two) times daily. Take 100mg  in the morning and 50mg  in the evening Take with or immediately following a meal.   Yes [provider]  ondansetron (ZOFRAN) 4 MG tablet Take 4 mg by mouth every 6 (six) hours as needed for nausea or vomiting.   Yes [provider]  ondansetron (ZOFRAN-ODT) 4 MG disintegrating tablet Take 4 mg by mouth every 6 (six) hours as needed for nausea or vomiting.   Yes [provider]  tamsulosin (FLOMAX) 0.4 MG CAPS capsule Take 0.4 mg by mouth at bedtime.   Yes [provider]  traZODone (DESYREL) 50 MG tablet Take 50 mg by mouth at bedtime.   Yes [provider]  aspirin 81 MG chewable tablet Chew 1 tablet (81 mg total) by mouth daily. 06/30/19   Shaune Pollackhen, Qing, MD  atorvastatin (LIPITOR) 40 MG tablet Take 1 tablet (40 mg total) by mouth daily at 6 PM. 06/30/19   Shaune Pollackhen, Qing, MD   No Known Allergies  FAMILY HISTORY:  family history includes Cancer in his father; Dementia in his father; Kidney disease in his mother; Stomach cancer in his sister. SOCIAL HISTORY:  reports that he has never smoked. He has never used smokeless tobacco. He reports that he does not drink alcohol or use drugs.  Review of Systems:  Gen:  Denies  fever, sweats, chills weight loss  HEENT: Denies blurred vision, double vision, ear pain, eye pain, hearing loss, nose bleeds, sore throat Cardiac:  No dizziness, chest  pain or heaviness, chest tightness,edema, No JVD Resp:   No cough, -sputum production, -shortness of breath,-wheezing, -hemoptysis,  Gi: Denies swallowing difficulty, stomach pain, nausea or vomiting, diarrhea, constipation, bowel incontinence Gu:  Denies bladder incontinence, burning urine Ext:  Left shoulder pain x 63mo. Denies Joint pain, stiffness or swelling Skin: Denies  skin rash, easy bruising or bleeding or hives Endoc:  Denies polyuria, polydipsia , polyphagia or weight change Psych:   Denies  depression, insomnia or hallucinations  Other:  All other systems negative   VITAL SIGNS: Temp:  [97.5 F (36.4 C)-98.5 F (36.9 C)] 97.7 F (36.5 C) (10/06 0724) Pulse Rate:  [55-67] 55 (10/06 1558) Resp:  [12-20] 16 (10/06 1558) BP: (74-129)/(54-86) 74/58 (10/06 1558) SpO2:  [93 %-98 %] 96 % (10/06 1558) Weight:  [56.7 kg] 56.7 kg (10/06 0414)   I/O last 3 completed shifts: In: 480 [P.O.:480] Out: 1325 [Urine:1325] Total I/O In: 1065.5 [P.O.:480; IV Piggyback:585.5] Out: 500 [Urine:500]   SpO2: 96 % O2 Flow Rate (L/min): 2 L/min   Physical Examination:  GENERAL: in no acute distress HEAD: Normocephalic, atraumatic.  EYES: Pupils equal, round, reactive to light.  No scleral icterus.  MOUTH: Moist mucosal membrane. NECK: Supple. No JVD.  PULMONARY: lungs clear to auscultation CARDIOVASCULAR: S1 and S2. Regular rate and rhythm. No murmurs, rubs, or gallops.  GASTROINTESTINAL: Soft, nontender, -distended. No masses. Positive bowel sounds. No hepatosplenomegaly.  MUSCULOSKELETAL: No swelling, clubbing, or edema.  NEUROLOGIC: obtunded SKIN:intact,warm,dry  I personally reviewed lab work that was obtained in last 24 hrs. CXR Independently reviewed  MEDICATIONS: I have reviewed all medications and confirmed regimen as documented   CULTURE RESULTS   Recent Results (from the past 240 hour(s))  SARS Coronavirus 2 [facility] order, Performed in [facility] hospital lab) Nasopharyngeal Nasopharyngeal Swab     Status: None   Collection Time: 06/28/19 10:01 PM   Specimen: Nasopharyngeal Swab  Result Value Ref Range Status   SARS Coronavirus 2 NEGATIVE NEGATIVE Final    Comment: (NOTE) If result is NEGATIVE SARS-CoV-2 target nucleic acids are NOT DETECTED. The SARS-CoV-2 RNA is generally detectable in upper and lower  respiratory specimens during the acute phase of infection. The lowest  concentration of SARS-CoV-2 viral copies this assay can detect is 250  copies / mL.  A negative result does not preclude SARS-CoV-2 infection  and should not be used as the sole basis for treatment or other  patient management decisions.  A negative result may occur with  improper specimen collection / handling, submission of specimen other  than nasopharyngeal swab, presence of viral mutation(s) within the  areas targeted by this assay, and inadequate number of viral copies  (<250 copies / mL). A negative result must be combined with clinical  observations, patient history, and epidemiological information. If result is POSITIVE SARS-CoV-2 target nucleic acids are DETECTED. The SARS-CoV-2 RNA is generally detectable in upper and lower  respiratory specimens dur ing the acute phase of infection.  Positive  results are indicative of active infection with SARS-CoV-2.  Clinical  correlation with patient history and other diagnostic information is  necessary to determine patient infection status.  Positive results do  not rule out bacterial infection or co-infection with other viruses. If result is PRESUMPTIVE POSTIVE SARS-CoV-2 nucleic acids MAY BE PRESENT.   A presumptive positive result was obtained on the submitted specimen  and confirmed on repeat testing.  While 2019 novel coronavirus  (SARS-CoV-2) nucleic acids may be present in the submitted sample  additional confirmatory testing may be necessary for epidemiological  and / or clinical management purposes  to differentiate between  SARS-CoV-2 and other Sarbecovirus currently known to infect humans.  If clinically indicated additional testing with an alternate test  methodology [phone]) is advised. The SARS-CoV-2 RNA is generally  detectable in upper and lower respiratory sp ecimens during the acute  phase of infection. The expected result is Negative. Fact Sheet for Patients:  StrictlyIdeas.no Fact Sheet for Healthcare Providers: BankingDealers.co.za This test is not  yet approved or cleared by the [place] FDA and has been authorized for detection and/or diagnosis of SARS-CoV-2 by FDA under an Emergency Use Authorization (EUA).  This EUA will remain in effect (meaning this test can be used) for the duration of the COVID-19 declaration under Section 564(b)(1) of the Act, 21 U.S.C. section 360bbb-3(b)(1), unless the authorization is terminated or revoked sooner. Performed at [facility], [address] [place], [place] [address]       ASSESSMENT AND PLAN SYNOPSIS 68 yo male with past medical history of diabetes, hypertension, CAD, and stroke presented to the ICU with acute onset hypotension and UA findings consistent with UTI leading to urosepsis.  SHOCK-SEPSIS -use vasopressors to keep MAP>65 if needed -follow ABG and LA -follow up cultures -emperic ABX -consider stress dose steroids -aggressive IV fluid resuscitation  CARDIAC ICU monitoring  ID -continue IV abx as prescibed -follow up cultures  GI GI PROPHYLAXIS as indicated  ENDO - will use ICU hypoglycemic\Hyperglycemia protocol if needed  ELECTROLYTES -follow labs as needed -replace as needed -pharmacy consultation and following  DVT/GI PRX ordered TRANSFUSIONS AS NEEDED MONITOR FSBS ASSESS the need for LABS      Corrin Parker, M.D.  [facility]  Medical Director [place] Director [place] Department

## 2019-06-30 NOTE — Discharge Instructions (Signed)
Hospice care

## 2019-06-30 NOTE — Progress Notes (Signed)
CBG at 0635 was 301 and PM RN gave 7 units of insulin. CBG at 0726 for the ACHS schedule is 317. This calls for another 7 units of insulin. MD contacted to verify if he wants this additional 7 units of insulin to be given.  Follow-up conversation with MD.  Directed to give 4 units to bring total dose of AM insulin to 11 (7 at 0659 + 4 units now) in accordance with sliding scale ordered.

## 2019-06-30 NOTE — Progress Notes (Signed)
The patient's BP is still low at 74/58 after 1 L NS bolus. Give anther bolus.  UA report positive nitrite and WBC>50. The patient has septic shock due to UTI. Transfer to stepdown unit and start sepsis protocol, levophed drip.  I disussd with RN and Dr. Mortimer Fries. I called his wife [name] at [phone], discussed with her about the patient's critical condition and treatment plan. Critical time spent 50 minutes. Sheppard Evens
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
## 2019-06-30 NOTE — Progress Notes (Signed)
PT Cancellation Note  Patient Details Name: Alan Newman MRN: [id number] DOB: Aug 07, 1951   Cancelled Treatment:    Reason Eval/Treat Not Completed: Patient at procedure or test/unavailable.  Order received and chart reviewed.  Nursing in room.  PT will return shortly when pt is available.  Roxanne Gates, PT, DPT  Roxanne Gates 06/30/2019, 9:39 AM

## 2019-06-30 NOTE — Progress Notes (Signed)
CBG 107 BP 129/74

## 2019-06-30 NOTE — Sepsis Progress Note (Signed)
Notified bedside nurse of need to draw repeat lactic acid after completion of fluid bolus.  

## 2019-07-01 LAB — PROCALCITONIN: Procalcitonin: <0.1 ng/mL

## 2019-07-01 LAB — TROPONIN I (HIGH SENSITIVITY): Troponin I (High Sensitivity): 7 ng/L (ref <18)

## 2019-07-01 LAB — GLUCOSE, CAPILLARY
Glucose-Capillary: 133 mg/dL — ABNORMAL HIGH (ref 70–99)
Glucose-Capillary: 167 mg/dL — ABNORMAL HIGH (ref 70–99)
Glucose-Capillary: 199 mg/dL — ABNORMAL HIGH (ref 70–99)
Glucose-Capillary: 204 mg/dL — ABNORMAL HIGH (ref 70–99)
Glucose-Capillary: 239 mg/dL — ABNORMAL HIGH (ref 70–99)
Glucose-Capillary: 276 mg/dL — ABNORMAL HIGH (ref 70–99)
Glucose-Capillary: 290 mg/dL — ABNORMAL HIGH (ref 70–99)

## 2019-07-01 LAB — CBC
HCT: 34.2 % — ABNORMAL LOW (ref 39.0–52.0)
Hemoglobin: 11.1 g/dL — ABNORMAL LOW (ref 13.0–17.0)
MCH: 29.5 pg (ref 26.0–34.0)
MCHC: 32.5 g/dL (ref 30.0–36.0)
MCV: 91 fL (ref 80.0–100.0)
Platelets: 188 10*3/uL (ref 150–400)
RBC: 3.76 MIL/uL — ABNORMAL LOW (ref 4.22–5.81)
RDW: 13.9 % (ref 11.5–15.5)
WBC: 5.4 10*3/uL (ref 4.0–10.5)
nRBC: 0 % (ref 0.0–0.2)

## 2019-07-01 LAB — BASIC METABOLIC PANEL
Anion gap: 7 (ref 5–15)
BUN: 13 mg/dL (ref 8–23)
CO2: 28 mmol/L (ref 22–32)
Calcium: 8.2 mg/dL — ABNORMAL LOW (ref 8.9–10.3)
Chloride: 104 mmol/L (ref 98–111)
Creatinine, Ser: 0.49 mg/dL — ABNORMAL LOW (ref 0.61–1.24)
GFR calc Af Amer: >60 mL/min (ref >60)
GFR calc non Af Amer: >60 mL/min (ref >60)
Glucose, Bld: 161 mg/dL — ABNORMAL HIGH (ref 70–99)
Potassium: 4.1 mmol/L (ref 3.5–5.1)
Sodium: 139 mmol/L (ref 135–145)

## 2019-07-01 LAB — MAGNESIUM: Magnesium: 1.9 mg/dL (ref 1.7–2.4)

## 2019-07-01 MED ORDER — HYDRALAZINE HCL 25 MG PO TABS
25.0000 mg | ORAL_TABLET | Freq: Once | ORAL | Status: AC
  Administered 2019-07-01: 25 mg via ORAL
  Filled 2019-07-01: qty 1

## 2019-07-01 MED ORDER — MORPHINE SULFATE (PF) 2 MG/ML IV SOLN
2.0000 mg | Freq: Once | INTRAVENOUS | Status: DC
  Filled 2019-07-01: qty 1

## 2019-07-01 MED ORDER — MUPIROCIN 2 % EX OINT
1.0000 "application " | TOPICAL_OINTMENT | Freq: Two times a day (BID) | CUTANEOUS | Status: DC
  Administered 2019-07-01 – 2019-07-02 (×2): 1 via NASAL
  Filled 2019-07-01: qty 22

## 2019-07-01 MED ORDER — CHLORHEXIDINE GLUCONATE CLOTH 2 % EX PADS
6.0000 | MEDICATED_PAD | Freq: Every day | CUTANEOUS | Status: DC
  Administered 2019-07-01: 6 via TOPICAL

## 2019-07-01 MED ORDER — METOPROLOL TARTRATE 5 MG/5ML IV SOLN
2.5000 mg | Freq: Once | INTRAVENOUS | Status: AC
  Administered 2019-07-01: 2.5 mg via INTRAVENOUS

## 2019-07-01 MED ORDER — NITROGLYCERIN 2 % TD OINT
0.5000 [in_us] | TOPICAL_OINTMENT | Freq: Four times a day (QID) | TRANSDERMAL | Status: DC
  Administered 2019-07-01 – 2019-07-02 (×2): 0.5 [in_us] via TOPICAL
  Filled 2019-07-01 (×2): qty 1

## 2019-07-01 NOTE — Progress Notes (Signed)
Patient Name: Alan Newman Date of Encounter: 07/01/2019  Hospital Problem List     Principal Problem:   Chest pain Active Problems:   HTN (hypertension)   Diabetes (HCC)   Hyperglycemia   Goals of care, counseling/discussion   Palliative care by specialist   DNR (do not resuscitate) discussion    Patient Profile     Pt 68 yo male with history of htn, diabetes admitted with sepsis. Has ruled out for mi. Transferred to icu for increased hypotension. Difficult historian  Subjective   Lethargic   Inpatient Medications    . aspirin  81 mg Oral Daily  . atorvastatin  40 mg Oral q1800  . Chlorhexidine Gluconate Cloth  6 each Topical Daily  . enoxaparin (LOVENOX) injection  40 mg Subcutaneous QHS  . insulin aspart  0-15 Units Subcutaneous Q4H  . insulin glargine  15 Units Subcutaneous Daily  . mouth rinse  15 mL Mouth Rinse BID  . tamsulosin  0.4 mg Oral QHS  . traZODone  50 mg Oral QHS    Vital Signs    Vitals:   07/01/19 0500 07/01/19 0600 07/01/19 0700 07/01/19 0800  BP: 120/80 104/82 109/82 117/86  Pulse: 65 66 69 70  Resp: 12 (!) 9 12 13   Temp:    98.3 F (36.8 C)  TempSrc:    Oral  SpO2: 96% 96% 96% 97%  Weight:      Height:        Intake/Output Summary (Last 24 hours) at 07/01/2019 0932 Last data filed at 07/01/2019 0800 Gross per 24 hour  Intake 4592.69 ml  Output 1275 ml  Net 3317.69 ml   Filed Weights   06/29/19 0431 06/30/19 0414 06/30/19 1712  Weight: 60.1 kg 56.7 kg 62.7 kg    Physical Exam    GEN: Well nourished, well developed, in no acute distress.  HEENT: normal.  Neck: Supple, no JVD, carotid bruits, or masses. Cardiac: RRR, no murmurs, rubs, or gallops. No clubbing, cyanosis, edema.  Radials/DP/PT 2+ and equal bilaterally.  Respiratory:  Respirations regular and unlabored, clear to auscultation bilaterally. GI: Soft, nontender, nondistended, BS + x 4. MS: no deformity or atrophy. Skin: warm and dry, no rash. Neuro:  Strength  and sensation are intact. Psych: Normal affect.  Labs    CBC Recent Labs    06/28/19 2130  06/30/19 1630 07/01/19 0546  WBC SPECIMEN CONTAMINATED, UNABLE TO PERFORM TEST(S).   < > 4.7 5.4  NEUTROABS SPECIMEN CONTAMINATED, UNABLE TO PERFORM TEST(S).  --  1.5*  --   HGB SPECIMEN CONTAMINATED, UNABLE TO PERFORM TEST(S).   < > 11.6* 11.1*  HCT SPECIMEN CONTAMINATED, UNABLE TO PERFORM TEST(S).   < > 35.5* 34.2*  MCV SPECIMEN CONTAMINATED, UNABLE TO PERFORM TEST(S).   < > 91.5 91.0  PLT SPECIMEN CONTAMINATED, UNABLE TO PERFORM TEST(S).   < > 234 188   < > = values in this interval not displayed.   Basic Metabolic Panel Recent Labs    19/14/7809/01/11 2215  06/30/19 1630 07/01/19 0546  NA 133*   < > 134* 139  K 4.5   < > 4.3 4.1  CL 95*   < > 98 104  CO2 24   < > 27 28  GLUCOSE 474*   < > 359* 161*  BUN 18   < > 18 13  CREATININE 0.50*   < > 0.47* 0.49*  CALCIUM 8.7*   < > 8.4* 8.2*  MG 1.6*  --   --  1.9  PHOS 3.4  --   --   --    < > = values in this interval not displayed.   Liver Function Tests Recent Labs    06/30/19 1630  AST 11*  ALT 9  ALKPHOS 77  BILITOT 0.3  PROT 5.4*  ALBUMIN 2.8*   No results for input(s): LIPASE, AMYLASE in the last 72 hours. Cardiac Enzymes No results for input(s): CKTOTAL, CKMB, CKMBINDEX, TROPONINI in the last 72 hours. BNP No results for input(s): BNP in the last 72 hours. D-Dimer No results for input(s): DDIMER in the last 72 hours. Hemoglobin A1C Recent Labs    06/30/19 0819  HGBA1C 12.9*   Fasting Lipid Panel Recent Labs    06/29/19 0628  CHOL 185  HDL 35*  LDLCALC 117*  TRIG 166*  CHOLHDL 5.3   Thyroid Function Tests No results for input(s): TSH, T4TOTAL, T3FREE, THYROIDAB in the last 72 hours.  Invalid input(s): FREET3  Telemetry    sr with no ischemia  ECG    Sinus rhythm with no ischemia.   Radiology    Ct Head Wo Contrast  Result Date: 06/30/2019 CLINICAL DATA:  Sudden onset visual changes.  History of  stroke. EXAM: CT HEAD WITHOUT CONTRAST TECHNIQUE: Contiguous axial images were obtained from the base of the skull through the vertex without intravenous contrast. COMPARISON:  MR brain dated August 30, 2018. CT head dated August 27, 2018. FINDINGS: Brain: No evidence of acute infarction, hemorrhage, hydrocephalus, extra-axial collection or mass lesion/mass effect. Right cerebellar encephalomalacia again noted. Interval placement of a right frontal approach ventriculostomy shunt with the tip in the left frontal horn. Although the bifrontal diameter is unchanged, there has been slight interval decrease in size of the lateral ventricle temporal and occipital horns, as well as the third ventricle. Vascular: Atherosclerotic vascular calcification of the carotid siphons. No hyperdense vessel. 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 Negative for fracture or focal lesion. Sinuses/Orbits: No acute finding. Other: None. IMPRESSION: 1. No acute intracranial abnormality. 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 2. Interval placement of a right frontal approach ventriculostomy shunt with decreased size of the lateral ventricle temporal and occipital horns as well as the third ventricle. 3. 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 Electronically Signed   By: Titus Dubin M.D.   On: 06/30/2019 18:51   Dg Chest Portable 1 View  Result Date: 06/28/2019 CLINICAL DATA:  Chest pain EXAM: PORTABLE CHEST 1 VIEW COMPARISON:  11/14/2018, 08/27/2018 FINDINGS: Post sternotomy changes. Subsegmental atelectasis in the right lower lung. Mild airspace disease at the left base. Posttraumatic and postsurgical deformity of the left thorax with evidence of multiple prior rib fractures. Old mid left clavicle fracture. Stable cardiomediastinal silhouette. IMPRESSION: 1. Patchy opacity at the left base may reflect atelectasis or minimal infiltrate. 2.  Subsegmental atelectasis in the right lower lung Electronically Signed   By: Donavan Foil M.D.   On: 06/28/2019 21:57    Assessment & Plan    1.  Chest pain              -Likely due to indigestion; patient currently not complaining of chest pain             -Troponin negative x 2 (5, 5), ECG without significant ischemic changes; will trend third troponin; Echo showed preserved lv function. Does not appear ischemic            2.  Type 2 Diabetes              -  Glucose elevated at 439; sliding scale insulin recommended  3. Sepsis-Will need to holg antihypertensive meds. Continue with careful volume resuscitation.  Signed, Darlin Priestly Zackry Deines MD 07/01/2019, 9:32 AM  Pager: [phone]

## 2019-07-01 NOTE — Progress Notes (Signed)
Patient has noted to have hypotension but now patient has elevated blood pressure, added hydralazine, Nitropaste because of chest pain.  I see that he had work-up for chest pain unremarkable.

## 2019-07-01 NOTE — Progress Notes (Signed)
Name: Alan Newman MRN: [id number] DOB: March 27, 1951     CONSULTATION DATE: 07/01/2019  CHIEF COMPLAINT:  Hypotension  SYNOPSIS: Alan Newman is a 68 yo male with past medical history of type 2 diabetes mellitus, CAD with quadruple bypass, hypertension, and stroke presents to ICU from the floor with hypotension and concern for septic shock. Originally, pt presented to ED on 10/4 complaining of chest pain relieved with sublingual nitroglycerine. EKG showed no evidence of acute infarct, cardiac enzymes were within normal limits, and findings were not concerning for cardiac ischemia.  SIGNIFICANT EVENTS/STUDIES:  10/4 Patchy opacity at left base atelectasis vs infiltrate; atelectasis in right lower lung 10/5 Echo - EF 50-55%, mild left ventricular hypertrophy 10/6 UA significant for pyuria, presence of bacteria and >500 glucose; culture pending 10/6 Transfer to ICU with concern for septic shock - treating with ceftriaxone 10/6 Head CT: no acute intracranial abnormality.   HISTORY OF PRESENT ILLNESS:   Arrived to ICU 10/6 with BP at 89/68 after 2L NS, no pressors required. Pt alert and awake, with no complaints. He had acute onset dizziness last night and a benign head CT was obtained. Pt can be transferred to a lower acuity floor.   PAST MEDICAL HISTORY :   has a past medical history of Diabetes mellitus without complication (Alan Newman), Hypertension, and Stroke (Alan Newman).  has a past surgical history that includes Cardiac surgery and Brain surgery. Prior to Admission medications   Medication Sig Start Date End Date Taking? Authorizing Provider  hyoscyamine (LEVSIN) 0.125 MG tablet Take 0.125 mg by mouth every 6 (six) hours as needed for cramping.   Yes [provider]  lisinopril (ZESTRIL) 10 MG tablet Take 10 mg by mouth 2 (two) times daily.   Yes [provider]  LORazepam (ATIVAN) 0.5 MG tablet Take 0.5 mg by mouth every 4 (four) hours as needed for anxiety.   Yes  [provider]  metFORMIN (GLUCOPHAGE) 1000 MG tablet Take 1,000 mg by mouth 2 (two) times daily with a meal.   Yes [provider]  metoprolol succinate (TOPROL-XL) 100 MG 24 hr tablet Take 50-100 mg by mouth 2 (two) times daily. Take 100mg  in the morning and 50mg  in the evening Take with or immediately following a meal.   Yes [provider]  ondansetron (ZOFRAN) 4 MG tablet Take 4 mg by mouth every 6 (six) hours as needed for nausea or vomiting.   Yes [provider]  ondansetron (ZOFRAN-ODT) 4 MG disintegrating tablet Take 4 mg by mouth every 6 (six) hours as needed for nausea or vomiting.   Yes [provider]  tamsulosin (FLOMAX) 0.4 MG CAPS capsule Take 0.4 mg by mouth at bedtime.   Yes [provider]  traZODone (DESYREL) 50 MG tablet Take 50 mg by mouth at bedtime.   Yes [provider]  aspirin 81 MG chewable tablet Chew 1 tablet (81 mg total) by mouth daily. 06/30/19   Alan Loll, MD  atorvastatin (LIPITOR) 40 MG tablet Take 1 tablet (40 mg total) by mouth daily at 6 PM. 06/30/19   Alan Loll, MD   No Known Allergies  FAMILY HISTORY:  family history includes Cancer in his father; Dementia in his father; Kidney disease in his mother; Stomach cancer in his sister. SOCIAL HISTORY:  reports that he has never smoked. He has never used smokeless tobacco. He reports that he does not drink alcohol or use drugs.  REVIEW OF SYSTEMS:   Unable to obtain due  to critical illness   Review of Systems:  Gen:  Denies  fever, sweats, chills weight loss  HEENT: Denies blurred vision, double vision, ear pain, eye pain, hearing loss, nose bleeds, sore throat Cardiac:  No dizziness, chest pain or heaviness, chest tightness,edema, No JVD Resp:   No cough, -sputum production, -shortness of breath,-wheezing, -hemoptysis,  Gi: Denies swallowing difficulty, stomach pain, nausea or vomiting, diarrhea, constipation, bowel incontinence Gu:   Denies bladder incontinence, burning urine Ext:   Denies Joint pain, stiffness or swelling Skin: Denies  skin rash, easy bruising or bleeding or hives Endoc:  Denies polyuria, polydipsia , polyphagia or weight change Psych:   Denies depression, insomnia or hallucinations  Other:  All other systems negative   VITAL SIGNS: Temp:  [97.8 F (36.6 C)-98.3 F (36.8 C)] 98.3 F (36.8 C) (10/07 0800) Pulse Rate:  [55-70] 70 (10/07 0800) Resp:  [4-17] 13 (10/07 0800) BP: (74-125)/(54-86) 117/86 (10/07 0800) SpO2:  [93 %-98 %] 97 % (10/07 0800) Weight:  [62.7 kg] 62.7 kg (10/06 1712)   I/O last 3 completed shifts: In: 4492.7 [P.O.:480; I.V.:1205.2; IV Piggyback:2807.4] Out: 1550 [Urine:1550] Total I/O In: 340 [P.O.:240; I.V.:100] Out: -    SpO2: 97 % O2 Flow Rate (L/min): 2 L/min   Physical Examination:  GENERAL: in no acute distress HEAD: Normocephalic, atraumatic.  EYES: Pupils equal, round, reactive to light.  No scleral icterus.  MOUTH: Moist mucosal membrane. NECK: Supple. No JVD.  PULMONARY: lungs clear to auscultation CARDIOVASCULAR: S1 and S2. Regular rate and rhythm. No murmurs, rubs, or gallops.  GASTROINTESTINAL: Soft, nontender, -distended. No masses. Positive bowel sounds. No hepatosplenomegaly.  MUSCULOSKELETAL: No swelling, clubbing, or edema.  NEUROLOGIC: A&Ox4, no focal deficit noted SKIN:intact,warm,dry  I personally reviewed lab work that was obtained in last 24 hrs. CXR Independently reviewed  MEDICATIONS: I have reviewed all medications and confirmed regimen as documented   CULTURE RESULTS   Recent Results (from the past 240 hour(s))  SARS Coronavirus 2 [facility](Hospital order, Performed in [facility] hospital lab) Nasopharyngeal Nasopharyngeal Swab     Status: None   Collection Time: 06/28/19 10:01 PM   Specimen: Nasopharyngeal Swab  Result Value Ref Range Status   SARS Coronavirus 2 NEGATIVE NEGATIVE Final    Comment: (NOTE) If result is  NEGATIVE SARS-CoV-2 target nucleic acids are NOT DETECTED. The SARS-CoV-2 RNA is generally detectable in upper and lower  respiratory specimens during the acute phase of infection. The lowest  concentration of SARS-CoV-2 viral copies this assay can detect is 250  copies / mL. A negative result does not preclude SARS-CoV-2 infection  and should not be used as the sole basis for treatment or other  patient management decisions.  A negative result may occur with  improper specimen collection / handling, submission of specimen other  than nasopharyngeal swab, presence of viral mutation(s) within the  areas targeted by this assay, and inadequate number of viral copies  (<250 copies / mL). A negative result must be combined with clinical  observations, patient history, and epidemiological information. If result is POSITIVE SARS-CoV-2 target nucleic acids are DETECTED. The SARS-CoV-2 RNA is generally detectable in upper and lower  respiratory specimens dur ing the acute phase of infection.  Positive  results are indicative of active infection with SARS-CoV-2.  Clinical  correlation with patient history and other diagnostic information is  necessary to determine patient infection status.  Positive results do  not rule out bacterial infection or co-infection with other viruses. If result is  PRESUMPTIVE POSTIVE SARS-CoV-2 nucleic acids MAY BE PRESENT.   A presumptive positive result was obtained on the submitted specimen  and confirmed on repeat testing.  While 2019 novel coronavirus  (SARS-CoV-2) nucleic acids may be present in the submitted sample  additional confirmatory testing may be necessary for epidemiological  and / or clinical management purposes  to differentiate between  SARS-CoV-2 and other Sarbecovirus currently known to infect humans.  If clinically indicated additional testing with an alternate test  methodology [phone]) is advised. The SARS-CoV-2 RNA is generally  detectable  in upper and lower respiratory sp ecimens during the acute  phase of infection. The expected result is Negative. Fact Sheet for Patients:  BoilerBrush.com.cy Fact Sheet for Healthcare Providers: https://pope.com/ This test is not yet approved or cleared by the [place] FDA and has been authorized for detection and/or diagnosis of SARS-CoV-2 by FDA under an Emergency Use Authorization (EUA).  This EUA will remain in effect (meaning this test can be used) for the duration of the COVID-19 declaration under Section 564(b)(1) of the Act, 21 U.S.C. section 360bbb-3(b)(1), unless the authorization is terminated or revoked sooner. Performed at [facility], [address] [place], [place] [address]   Culture, blood (x 2)     Status: None (Preliminary result)   Collection Time: 06/30/19  4:30 PM   Specimen: BLOOD RIGHT HAND  Result Value Ref Range Status   Specimen Description BLOOD RIGHT HAND  Final   Special Requests   Final    BOTTLES DRAWN AEROBIC AND ANAEROBIC Blood Culture results may not be optimal due to an inadequate volume of blood received in culture bottles   Culture   Final    NO GROWTH < 24 HOURS Performed at [facility], [address], [place], [place] [address]    Report Status PENDING  Incomplete  Culture, blood (x 2)     Status: None (Preliminary result)   Collection Time: 06/30/19  4:30 PM   Specimen: BLOOD LEFT HAND  Result Value Ref Range Status   Specimen Description BLOOD LEFT HAND  Final   Special Requests   Final    BOTTLES DRAWN AEROBIC ONLY Blood Culture results may not be optimal due to an inadequate volume of blood received in culture bottles   Culture   Final    NO GROWTH < 24 HOURS Performed at [facility], [address], [place], [place] [address]    Report Status PENDING  Incomplete  MRSA PCR Screening     Status: Abnormal   Collection Time: 06/30/19  5:16 PM    Specimen: Nasal Mucosa; Nasopharyngeal  Result Value Ref Range Status   MRSA by PCR POSITIVE (A) NEGATIVE Final    Comment:        The GeneXpert MRSA Assay (FDA approved for NASAL specimens only), is one component of a comprehensive MRSA colonization surveillance program. It is not intended to diagnose MRSA infection nor to guide or monitor treatment for MRSA infections. RESULT CALLED TO, READ BACK BY AND VERIFIED WITH: BRANDY MCGEE  06/30/19 MJU Performed at [facility], [address] [place], [place] [address]           IMAGING    Ct Head Wo Contrast  Result Date: 06/30/2019 CLINICAL DATA:  Sudden onset visual changes.  History of stroke. EXAM: CT HEAD WITHOUT CONTRAST TECHNIQUE: Contiguous axial images were obtained from the base of the skull through the vertex without intravenous contrast. COMPARISON:  MR brain dated  August 30, 2018. CT head dated August 27, 2018. FINDINGS: Brain: No evidence of acute infarction, hemorrhage, hydrocephalus, extra-axial collection or mass lesion/mass effect. Right cerebellar encephalomalacia again noted. Interval placement of a right frontal approach ventriculostomy shunt with the tip in the left frontal horn. Although the bifrontal diameter is unchanged, there has been slight interval decrease in size of the lateral ventricle temporal and occipital horns, as well as the third ventricle. Vascular: Atherosclerotic vascular calcification of the carotid siphons. No hyperdense vessel. 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 Negative for fracture or focal lesion. Sinuses/Orbits: No acute finding. Other: None. IMPRESSION: 1. No acute intracranial abnormality. 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 2. Interval placement of a right frontal approach ventriculostomy shunt with decreased size of the lateral ventricle temporal and occipital horns as well as the third ventricle. 3. Prior  suboccipital craniectomy with unchanged retrocerebellar pseudomeningocele. Electronically Signed   By: Obie Dredge M.D.   On: 06/30/2019 18:51     ASSESSMENT AND PLAN SYNOPSIS 68 yo male with past medical history of diabetes, hypertension, CAD, and stroke presented to the ICU with acute onset hypotension and UA findings consistent with UTI leading to possible urosepsis.   SHOCK-SEPSIS Hypotension resolved without use of vasopressors; procalcitonin <0.10, lactic acid 2.0 -use vasopressors to keep MAP>65 if needed -follow ABG and LA -follow up cultures -continue IV ceftriaxone -continue IV fluids  CARDIAC ICU monitoring  ID -continue IV abx as prescibed -follow up cultures  GI GI PROPHYLAXIS as indicated  ENDO - will use ICU hypoglycemic\Hyperglycemia protocol if needed  ELECTROLYTES -follow labs as needed -replace as needed -pharmacy consultation and following  DVT/GI PRX ordered TRANSFUSIONS AS NEEDED MONITOR FSBS ASSESS the need for LABS   Pt has orders to transfer to med-surg unit.  Rometta Emery, PA-S

## 2019-07-01 NOTE — Progress Notes (Signed)
[facility] [place] [facility]   PATIENT NAME: Alan Newman    MR#:  [id number]  DATE OF BIRTH:  Dec 27, 1950  SUBJECTIVE:  CHIEF COMPLAINT:   Chief Complaint  Patient presents with  . Chest Pain   The patient has no complaints.  The patient was given normal saline bolus due to hypotension last night, finally transferred to ICU. BP improved now, no complains.  REVIEW OF SYSTEMS:  Review of Systems  Constitutional: Negative for chills, fever and malaise/fatigue.  HENT: Negative for sore throat.   Eyes: Negative for blurred vision and double vision.  Respiratory: Negative for cough, hemoptysis, shortness of breath, wheezing and stridor.   Cardiovascular: Negative for chest pain, palpitations, orthopnea and leg swelling.  Gastrointestinal: Negative for abdominal pain, blood in stool, diarrhea, melena, nausea and vomiting.  Genitourinary: Negative for dysuria, flank pain and hematuria.  Musculoskeletal: Negative for back pain and joint pain.  Neurological: Negative for dizziness, sensory change, focal weakness, seizures, loss of consciousness, weakness and headaches.  Endo/Heme/Allergies: Negative for polydipsia.  Psychiatric/Behavioral: Negative for depression. The patient is not nervous/anxious.     DRUG ALLERGIES:  No Known Allergies VITALS:  Blood pressure (!) 160/91, pulse 76, temperature 98.2 F (36.8 C), temperature source Oral, resp. rate 12, height 5\' 9"  (1.753 m), weight 62.7 kg, SpO2 97 %. PHYSICAL EXAMINATION:  Physical Exam Constitutional:      General: He is not in acute distress. HENT:     Head: Normocephalic.  Eyes:     General: No scleral icterus.    Conjunctiva/sclera: Conjunctivae normal.     Pupils: Pupils are equal, round, and reactive to light.  Neck:     Musculoskeletal: Normal range of motion and neck supple.     Vascular: No JVD.     Trachea: No tracheal deviation.  Cardiovascular:     Rate and Rhythm: Normal rate and  regular rhythm.     Heart sounds: Normal heart sounds. No murmur. No gallop.   Pulmonary:     Effort: Pulmonary effort is normal. No respiratory distress.     Breath sounds: Normal breath sounds. No wheezing or rales.  Abdominal:     General: Bowel sounds are normal. There is no distension.     Palpations: Abdomen is soft.     Tenderness: There is no abdominal tenderness. There is no rebound.  Musculoskeletal: Normal range of motion.        General: No tenderness.     Right lower leg: No edema.     Left lower leg: No edema.     Comments: muscle atrophy.  Skin:    Findings: No erythema or rash.  Neurological:     Mental Status: He is alert and oriented to person, place, and time.     Cranial Nerves: No cranial nerve deficit.    LABORATORY PANEL:  Male CBC Recent Labs  Lab 07/01/19 0546  WBC 5.4  HGB 11.1*  HCT 34.2*  PLT 188   ------------------------------------------------------------------------------------------------------------------ Chemistries  Recent Labs  Lab 06/30/19 1630 07/01/19 0546  NA 134* 139  K 4.3 4.1  CL 98 104  CO2 27 28  GLUCOSE 359* 161*  BUN 18 13  CREATININE 0.47* 0.49*  CALCIUM 8.4* 8.2*  MG  --  1.9  AST 11*  --   ALT 9  --   ALKPHOS 77  --   BILITOT 0.3  --    RADIOLOGY:  Ct Head Wo Contrast  Result Date: 06/30/2019  CLINICAL DATA:  Sudden onset visual changes.  History of stroke. EXAM: CT HEAD WITHOUT CONTRAST TECHNIQUE: Contiguous axial images were obtained from the base of the skull through the vertex without intravenous contrast. COMPARISON:  MR brain dated August 30, 2018. CT head dated August 27, 2018. FINDINGS: Brain: No evidence of acute infarction, hemorrhage, hydrocephalus, extra-axial collection or mass lesion/mass effect. Right cerebellar encephalomalacia again noted. Interval placement of a right frontal approach ventriculostomy shunt with the tip in the left frontal horn. Although the bifrontal diameter is unchanged,  there has been slight interval decrease in size of the lateral ventricle temporal and occipital horns, as well as the third ventricle. Vascular: Atherosclerotic vascular calcification of the carotid siphons. No hyperdense vessel. 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 Negative for fracture or focal lesion. Sinuses/Orbits: No acute finding. Other: None. IMPRESSION: 1. No acute intracranial abnormality. 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 2. Interval placement of a right frontal approach ventriculostomy shunt with decreased size of the lateral ventricle temporal and occipital horns as well as the third ventricle. 3. 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 Electronically Signed   By: Titus Dubin M.D.   On: 06/30/2019 18:51   ASSESSMENT AND PLAN:   Chest pain  His cardiac enzymes are normal.echocardiography is unremarkable.  For cardiology consult, no other cardiac workup indicated at this time     CAD -continue home meds   HTN (hypertension) -hold hypertension medication Lopressor and lisinopril due to hypotension.  Hypotension- sepsis due to UTI.  Blood pressure is low again, 76/60, normal saline bolus. IV rocephin, follow cultures, BP stable.    Diabetes ([place]) -increase to moderate sliding scale insulin, added Lantus 15 units daily.  Hypomagnesemia.  IV magnesium and follow-up level.  Generalized weakness.  PT evaluation suggest skilled nursing facility placement.    Patient is bedbound at baseline.  All the records are reviewed and case discussed with Care Management/Social Worker. Management plans discussed with the patient, family and they are in agreement.  CODE STATUS: Full Code  TOTAL TIME TAKING CARE OF THIS PATIENT: 32 minutes.   More than 50% of the time was spent in counseling/coordination of care: YES  POSSIBLE D/C IN 2 DAYS, DEPENDING ON CLINICAL CONDITION.   Vaughan Basta M.D on 07/01/2019 at 4:14 PM  Between 7am to 6pm - Pager - [phone]  After 6pm go to [email or web address] - [facility]

## 2019-07-01 NOTE — Progress Notes (Signed)
Palliative: Alan Newman is resting quietly in bed with the phone at his ear. He is talking with his wife. He and wife ask about what has happened to bring him to ICU.  We talk about UTI and sepsis.  Alan Newman asks about transfer and I share that he will be transferred to the floor, but will not be able to leave the hospital today.   We talk about the treatment plan and giving medications time to work.  I ask wife if she has questions, but she tearfully shares that she just wants to talk with Alan Newman at this time. All questions answered.    Conference with bedside nursing staff and SW related to patient condition, needs, GOC and discharge.  PMT to follow.   Plan:  Home with Hospice when stable.  Active with [facility].  Code status discussed.  Would like 2 week trial of life support, but no trach.   79 minutes Quinn Axe, NP Palliative Medicine Team Team Phone # [phone] Greater than 50% of this time was spent counseling and coordinating care related to the above assessment and plan.

## 2019-07-01 NOTE — Progress Notes (Signed)
Results for ASHWIN, TIBBS (MRN [id number]) as of 07/01/2019 13:01  Ref. Range 06/30/2019 08:19  Hemoglobin A1C Latest Ref Range: 4.8 - 5.6 % 12.9 (H)   Results for ROLLAN, ROGER (MRN [id number]) as of 07/01/2019 13:01  Ref. Range 07/01/2019 00:01 07/01/2019 04:16 07/01/2019 07:26  Glucose-Capillary Latest Ref Range: 70 - 99 mg/dL 239 (H) 167 (H) 133 (H)   Admit with: CP  History: DM, CVA, HTN  Home DM Meds:  Metformin 1000 mg BID  Current Orders: Novolog Moderate Correction Scale/ SSI (0-15 units) Q4 hours      Lantus 15 units Daily     MD- Note that CBGs have improved since yesterday.  Unsure what discharge plans are at this point and unsure how aggressive we need to be for his CBG control given his current situation/ Hospice at home, etc.  Do you anticipate patient will need insulin at discharge??  How aggressive do pt and wife want pt's CBG control to be??  Could we try adding another oral DM medication to his home meds instead of insulin?     --Will follow patient during hospitalization--  Wyn Quaker RN, MSN, CDE Diabetes Coordinator Inpatient Glycemic Control Team Team Pager: [phone] (8a-5p)

## 2019-07-02 LAB — GLUCOSE, CAPILLARY
Glucose-Capillary: 193 mg/dL — ABNORMAL HIGH (ref 70–99)
Glucose-Capillary: 218 mg/dL — ABNORMAL HIGH (ref 70–99)
Glucose-Capillary: 218 mg/dL — ABNORMAL HIGH (ref 70–99)
Glucose-Capillary: 227 mg/dL — ABNORMAL HIGH (ref 70–99)

## 2019-07-02 LAB — TROPONIN I (HIGH SENSITIVITY): Troponin I (High Sensitivity): 9 ng/L (ref <18)

## 2019-07-02 LAB — PROCALCITONIN: Procalcitonin: <0.1 ng/mL

## 2019-07-02 LAB — URINE CULTURE: Culture: >=100000 — AB

## 2019-07-02 MED ORDER — NITROGLYCERIN 2 % TD OINT: 0.5000 [in_us] | TOPICAL_OINTMENT | Freq: Four times a day (QID) | TRANSDERMAL | Status: DC | PRN

## 2019-07-02 MED ORDER — METOPROLOL TARTRATE 50 MG PO TABS: 50.0000 mg | ORAL_TABLET | Freq: Two times a day (BID) | ORAL | 0 refills | Status: DC

## 2019-07-02 MED ORDER — CEFUROXIME AXETIL 250 MG PO TABS: 250.0000 mg | ORAL_TABLET | Freq: Two times a day (BID) | ORAL | 0 refills | Status: AC

## 2019-07-02 MED ORDER — LISINOPRIL 10 MG PO TABS
10.0000 mg | ORAL_TABLET | Freq: Every day | ORAL | Status: DC
  Administered 2019-07-02: 10 mg via ORAL
  Filled 2019-07-02: qty 1

## 2019-07-02 MED ORDER — ADULT MULTIVITAMIN W/MINERALS CH: 1.0000 | ORAL_TABLET | Freq: Every day | ORAL | Status: DC

## 2019-07-02 MED ORDER — ASPIRIN 81 MG PO CHEW: 81.0000 mg | CHEWABLE_TABLET | Freq: Every day | ORAL | 2 refills | Status: DC

## 2019-07-02 MED ORDER — ATORVASTATIN CALCIUM 40 MG PO TABS: 40.0000 mg | ORAL_TABLET | Freq: Every day | ORAL | 1 refills | Status: DC

## 2019-07-02 MED ORDER — METOPROLOL TARTRATE 50 MG PO TABS
50.0000 mg | ORAL_TABLET | Freq: Two times a day (BID) | ORAL | Status: DC
  Administered 2019-07-02: 50 mg via ORAL
  Filled 2019-07-02: qty 1

## 2019-07-02 MED ORDER — INSULIN GLARGINE 100 UNITS/ML SOLOSTAR PEN: 15.0000 [IU] | PEN_INJECTOR | Freq: Every day | SUBCUTANEOUS | 10 refills | Status: AC

## 2019-07-02 MED ORDER — ENSURE MAX PROTEIN PO LIQD
11.0000 [oz_av] | Freq: Two times a day (BID) | ORAL | Status: DC
  Filled 2019-07-02: qty 330

## 2019-07-02 MED ORDER — MORPHINE SULFATE (PF) 2 MG/ML IV SOLN: 2.0000 mg | Freq: Once | INTRAVENOUS | Status: DC | PRN

## 2019-07-02 NOTE — Care Management Important Message (Signed)
Important Message  Patient Details  Name: Alan Newman MRN: [id number] Date of Birth: May 25, 1951   Medicare Important Message Given:  Yes     Dannette Barbara 07/02/2019, 1:31 PM

## 2019-07-02 NOTE — Progress Notes (Signed)
Called to let pt's wife know EMS was at hospital to transport home. No answer. Left voicemail for pt's wife that he was on the way home.

## 2019-07-02 NOTE — Progress Notes (Signed)
Reviewed discharge AVS with pt's wife via telephone. Denies questions.  Nsg Discharge Note  Admit Date:  06/28/2019 Discharge date: 07/02/2019   Idalia Needle to be D/C'd Home per MD order.  AVS completed.  Copy for chart, and copy for patient signed, and dated. Patient/caregiver able to verbalize understanding.  Discharge Medication: Allergies as of 07/02/2019   No Known Allergies     Medication List    STOP taking these medications   metFORMIN 1000 MG tablet Commonly known as: GLUCOPHAGE   metoprolol succinate 100 MG 24 hr tablet Commonly known as: TOPROL-XL     TAKE these medications   aspirin 81 MG chewable tablet Chew 1 tablet (81 mg total) by mouth daily.   atorvastatin 40 MG tablet Commonly known as: LIPITOR Take 1 tablet (40 mg total) by mouth daily at 6 PM.   cefUROXime 250 MG tablet Commonly known as: Ceftin Take 1 tablet (250 mg total) by mouth 2 (two) times daily for 3 days.   hyoscyamine 0.125 MG tablet Commonly known as: LEVSIN Take 0.125 mg by mouth every 6 (six) hours as needed for cramping.   insulin glargine 100 unit/mL Sopn Commonly known as: LANTUS Inject 0.15 mLs (15 Units total) into the skin daily.   lisinopril 10 MG tablet Commonly known as: ZESTRIL Take 10 mg by mouth 2 (two) times daily.   LORazepam 0.5 MG tablet Commonly known as: ATIVAN Take 0.5 mg by mouth every 4 (four) hours as needed for anxiety.   metoprolol tartrate 50 MG tablet Commonly known as: LOPRESSOR Take 1 tablet (50 mg total) by mouth 2 (two) times daily. Notes to patient: 07/02/19   ondansetron 4 MG disintegrating tablet Commonly known as: ZOFRAN-ODT Take 4 mg by mouth every 6 (six) hours as needed for nausea or vomiting.   ondansetron 4 MG tablet Commonly known as: ZOFRAN Take 4 mg by mouth every 6 (six) hours as needed for nausea or vomiting.   tamsulosin 0.4 MG Caps capsule Commonly known as: FLOMAX Take 0.4 mg by mouth at bedtime.   traZODone 50 MG  tablet Commonly known as: DESYREL Take 50 mg by mouth at bedtime.       Discharge Assessment: Vitals:   07/02/19 0346 07/02/19 1213  BP: (!) 129/94 97/75  Pulse: 83 (!) 58  Resp: 20 14  Temp: 98.5 F (36.9 C) 98.4 F (36.9 C)  SpO2: 97% 98%   Skin clean, dry and intact without evidence of skin break down, no evidence of skin tears noted. IV catheter discontinued intact. Site without signs and symptoms of complications - no redness or edema noted at insertion site, patient denies c/o pain - only slight tenderness at site.  Dressing with slight pressure applied.  D/c Instructions-Education: Discharge instructions given to patient/family with verbalized understanding. D/c education completed with patient/family including follow up instructions, medication list, d/c activities limitations if indicated, with other d/c instructions as indicated by MD - patient able to verbalize understanding, all questions fully answered. Patient instructed to return to ED, call 911, or call MD for any changes in condition.  Patient escorted via [place], and D/C home via private auto.  Eda Keys, RN 07/02/2019 2:39 PM

## 2019-07-02 NOTE — Progress Notes (Signed)
Palliative: Alan Newman is resting quietly in bed.  He greet me, making and mostly keeping eye contact.  He is calm and cooperative, able to make his basic needs known.  There is no family at bedside at this time.   We talk about discharge plan and he tells me that he is to discharge today.  I encourage him to work with Hospice provider, [facility], for any needs.  Alan Newman shares that he is concerned about his blood sugars and not having insulin.  He tells me that he has not had to take insulin at home.  I share that people with acute illness sometimes need insulin in the hospital, but do not need insulin at home.  He states understanding.   Plan:  Home with [facility], services already in place. Re hospitalize as needed. Continue code status discussions, at this point limited time on life support 2 weeks, no trach.   65 minutes Quinn Axe, NP Palliative Medicine Team Team Phone # [phone] Greater than 50% of this time was spent counseling and coordinating care related to the above assessment and plan.

## 2019-07-02 NOTE — Progress Notes (Signed)
EMS called. Pt is 3rd in line at this time. Will notify pt's wife when EMS arrives.

## 2019-07-02 NOTE — Discharge Summary (Signed)
[facility] -  at [facility]   PATIENT NAME: Alan Newman    MR#:  [id number]  DATE OF BIRTH:  [birth year]  DATE OF ADMISSION:  06/28/2019 ADMITTING PHYSICIAN: Oralia Manis, MD  DATE OF DISCHARGE: 07/02/2019   PRIMARY CARE PHYSICIAN: Patient, No Pcp Per    ADMISSION DIAGNOSIS:  Chest pain with moderate risk for cardiac etiology [R07.9]  DISCHARGE DIAGNOSIS:  Principal Problem:   Chest pain Active Problems:   HTN (hypertension)   Diabetes (HCC)   Hyperglycemia   Goals of care, counseling/discussion   Palliative care by specialist   DNR (do not resuscitate) discussion   SECONDARY DIAGNOSIS:   Past Medical History:  Diagnosis Date  . Diabetes mellitus without complication (HCC)   . Hypertension   . Stroke [facility])     HOSPITAL COURSE:   Chest pain  His cardiac enzymes are normal.echocardiography is unremarkable.  For cardiology consult, no other cardiac workup indicated at this time   CAD -continue home meds  HTN (hypertension) -hold hypertension medication Lopressor and lisinopril due to hypotension.     Resume medications now as his blood pressure is stable.  Hypotension- sepsis due to UTI.  Blood pressure is low again, 76/60, normal saline bolus. IV rocephin, follow cultures, BP stable.   BP Stable now. Improved.  Pt have chronic Foley.  Catheter changed during this hospitalization.  Diabetes (HCC) -increase to moderate sliding scale insulin, added Lantus 15 units daily.  Patient was taking metformin at home.  His hemoglobin A1c was 12.9. I will prescribe Lantus on discharge, if it is covered by his insurance or hospice.  Hypomagnesemia.  IV magnesium and follow-up level.  Generalized weakness.  PT evaluation suggest skilled nursing facility placement.    Patient is bedbound at baseline.  DISCHARGE CONDITIONS:   Stable.  CONSULTS OBTAINED:  Treatment Team:  Dalia Heading, MD Alwyn Pea, MD  DRUG  ALLERGIES:  No Known Allergies  DISCHARGE MEDICATIONS:   Allergies as of 07/02/2019   No Known Allergies     Medication List    STOP taking these medications   metFORMIN 1000 MG tablet Commonly known as: GLUCOPHAGE   metoprolol succinate 100 MG 24 hr tablet Commonly known as: TOPROL-XL     TAKE these medications   aspirin 81 MG chewable tablet Chew 1 tablet (81 mg total) by mouth daily.   atorvastatin 40 MG tablet Commonly known as: LIPITOR Take 1 tablet (40 mg total) by mouth daily at 6 PM.   cefUROXime 250 MG tablet Commonly known as: Ceftin Take 1 tablet (250 mg total) by mouth 2 (two) times daily for 3 days.   hyoscyamine 0.125 MG tablet Commonly known as: LEVSIN Take 0.125 mg by mouth every 6 (six) hours as needed for cramping.   insulin glargine 100 unit/mL Sopn Commonly known as: LANTUS Inject 0.15 mLs (15 Units total) into the skin daily.   lisinopril 10 MG tablet Commonly known as: ZESTRIL Take 10 mg by mouth 2 (two) times daily.   LORazepam 0.5 MG tablet Commonly known as: ATIVAN Take 0.5 mg by mouth every 4 (four) hours as needed for anxiety.   metoprolol tartrate 50 MG tablet Commonly known as: LOPRESSOR Take 1 tablet (50 mg total) by mouth 2 (two) times daily.   ondansetron 4 MG disintegrating tablet Commonly known as: ZOFRAN-ODT Take 4 mg by mouth every 6 (six) hours as needed for nausea or vomiting.   ondansetron 4 MG tablet Commonly known as: ZOFRAN  Take 4 mg by mouth every 6 (six) hours as needed for nausea or vomiting.   tamsulosin 0.4 MG Caps capsule Commonly known as: FLOMAX Take 0.4 mg by mouth at bedtime.   traZODone 50 MG tablet Commonly known as: DESYREL Take 50 mg by mouth at bedtime.        DISCHARGE INSTRUCTIONS:    Follow with hospice at home.  If you experience worsening of your admission symptoms, develop shortness of breath, life threatening emergency, suicidal or homicidal thoughts you must seek medical  attention immediately by calling 911 or calling your MD immediately  if symptoms less severe.  You Must read complete instructions/literature along with all the possible adverse reactions/side effects for all the Medicines you take and that have been prescribed to you. Take any new Medicines after you have completely understood and accept all the possible adverse reactions/side effects.   Please note  You were cared for by a hospitalist during your hospital stay. If you have any questions about your discharge medications or the care you received while you were in the hospital after you are discharged, you can call the unit and asked to speak with the hospitalist on call if the hospitalist that took care of you is not available. Once you are discharged, your primary care physician will handle any further medical issues. Please note that NO REFILLS for any discharge medications will be authorized once you are discharged, as it is imperative that you return to your primary care physician (or establish a relationship with a primary care physician if you do not have one) for your aftercare needs so that they can reassess your need for medications and monitor your lab values.    Today   CHIEF COMPLAINT:   Chief Complaint  Patient presents with  . Chest Pain    HISTORY OF PRESENT ILLNESS:  Alan Newman  is a 68 y.o. male presents with chief complaint as above.  Patient was laying down tonight when he had an episode of chest tightness.  He states that it persisted until EMS got there and gave him sublingual nitro, then it subsided.  He does have a prior history of CAD, stating he had a quadruple bypass some years ago after having a mild heart attack.  Initial work-up here in the ED is largely within normal limits.  Hospitalist were called for admission and further evaluation    VITAL SIGNS:  Blood pressure 97/75, pulse (!) 58, temperature 98.4 F (36.9 C), temperature source Oral, resp. rate 14,  height  (1.753 m), weight 62.7 kg, SpO2 98 %.  I/O:    Intake/Output Summary (Last 24 hours) at 07/02/2019 1346 Last data filed at 07/02/2019 1339 Gross per 24 hour  Intake 360 ml  Output 1200 ml  Net -840 ml    PHYSICAL EXAMINATION:   Constitutional:      General: He is not in acute distress. HENT:     Head: Normocephalic.  Eyes:     General: No scleral icterus.    Conjunctiva/sclera: Conjunctivae normal.     Pupils: Pupils are equal, round, and reactive to light.  Neck:     Musculoskeletal: Normal range of motion and neck supple.     Vascular: No JVD.     Trachea: No tracheal deviation.  Cardiovascular:     Rate and Rhythm: Normal rate and regular rhythm.     Heart sounds: Normal heart sounds. No murmur. No gallop.   Pulmonary:     Effort:  Pulmonary effort is normal. No respiratory distress.     Breath sounds: Normal breath sounds. No wheezing or rales.  Abdominal:     General: Bowel sounds are normal. There is no distension.     Palpations: Abdomen is soft.     Tenderness: There is no abdominal tenderness. There is no rebound. Foley in place. Musculoskeletal:        General: No tenderness.     Right lower leg: No edema.     Left lower leg: No edema.     Comments: muscle atrophy. weakness in both lower limbs. Skin:    Findings: No erythema or rash.  Neurological:     Mental Status: He is alert and oriented to person, place, and time.     Cranial Nerves: No cranial nerve deficit.   DATA REVIEW:   CBC Recent Labs  Lab 07/01/19 0546  WBC 5.4  HGB 11.1*  HCT 34.2*  PLT 188    Chemistries  Recent Labs  Lab 06/30/19 1630 07/01/19 0546  NA 134* 139  K 4.3 4.1  CL 98 104  CO2 27 28  GLUCOSE 359* 161*  BUN 18 13  CREATININE 0.47* 0.49*  CALCIUM 8.4* 8.2*  MG  --  1.9  AST 11*  --   ALT 9  --   ALKPHOS 77  --   BILITOT 0.3  --     Cardiac Enzymes No results for input(s): TROPONINI in the last 168 hours.  Microbiology Results  Results for  orders placed or performed during the hospital encounter of 06/28/19  SARS Coronavirus 2 [facility](Hospital order, [facility]) Nasopharyngeal Nasopharyngeal Swab     Status: None   Collection Time: 06/28/19 10:01 PM   Specimen: Nasopharyngeal Swab  Result Value Ref Range Status   SARS Coronavirus 2 NEGATIVE NEGATIVE Final    Comment: (NOTE) If result is NEGATIVE SARS-CoV-2 target nucleic acids are NOT DETECTED. The SARS-CoV-2 RNA is generally detectable in upper and lower  respiratory specimens during the acute phase of infection. The lowest  concentration of SARS-CoV-2 viral copies this assay can detect is 250  copies / mL. A negative result does not preclude SARS-CoV-2 infection  and should not be used as the sole basis for treatment or other  patient management decisions.  A negative result may occur with  improper specimen collection / handling, submission of specimen other  than nasopharyngeal swab, presence of viral mutation(s) within the  areas targeted by this assay, and inadequate number of viral copies  (<250 copies / mL). A negative result must be combined with clinical  observations, patient history, and epidemiological information. If result is POSITIVE SARS-CoV-2 target nucleic acids are DETECTED. The SARS-CoV-2 RNA is generally detectable in upper and lower  respiratory specimens dur ing the acute phase of infection.  Positive  results are indicative of active infection with SARS-CoV-2.  Clinical  correlation with patient history and other diagnostic information is  necessary to determine patient infection status.  Positive results do  not rule out bacterial infection or co-infection with other viruses. If result is PRESUMPTIVE POSTIVE SARS-CoV-2 nucleic acids MAY BE PRESENT.   A presumptive positive result was obtained on the submitted specimen  and confirmed on repeat testing.  While 2019 novel coronavirus  (SARS-CoV-2) nucleic acids may be present  in the submitted sample  additional confirmatory testing may be necessary for epidemiological  and / or clinical management purposes  to differentiate between  SARS-CoV-2 and other  Sarbecovirus currently known to infect humans.  If clinically indicated additional testing with an alternate test  methodology [id number]) is advised. The SARS-CoV-2 RNA is generally  detectable in upper and lower respiratory sp ecimens during the acute  phase of infection. The expected result is Negative. Fact Sheet for Patients:  BoilerBrush.com.cy Fact Sheet for Healthcare Providers: https://pope.com/ This test is not yet approved or cleared by the [place] FDA and has been authorized for detection and/or diagnosis of SARS-CoV-2 by FDA under an Emergency Use Authorization (EUA).  This EUA will remain in effect (meaning this test can be used) for the duration of the COVID-19 declaration under Section 564(b)(1) of the Act, 21 U.S.C. section 360bbb-3(b)(1), unless the authorization is terminated or revoked sooner. Performed at [facility], [address], [place], [place] [address]   Urine Culture     Status: Abnormal   Collection Time: 06/30/19  2:57 PM   Specimen: Urine, Random  Result Value Ref Range Status   Specimen Description   Final    URINE, RANDOM Performed at [facility], [address] [place], [place] [address]    Special Requests   Final    NONE Performed at [facility], [address] [place], [place] [address]    Culture >=100,000 COLONIES/mL KLEBSIELLA PNEUMONIAE (A)  Final   Report Status 07/02/2019 FINAL  Final   Organism ID, Bacteria KLEBSIELLA PNEUMONIAE (A)  Final      Susceptibility   Klebsiella pneumoniae - MIC*    AMPICILLIN >=32 RESISTANT Resistant     CEFAZOLIN <=4 SENSITIVE Sensitive     CEFTRIAXONE <=1 SENSITIVE Sensitive     CIPROFLOXACIN <=0.25 SENSITIVE Sensitive     GENTAMICIN  <=1 SENSITIVE Sensitive     IMIPENEM <=0.25 SENSITIVE Sensitive     NITROFURANTOIN 128 RESISTANT Resistant     TRIMETH/SULFA <=20 SENSITIVE Sensitive     AMPICILLIN/SULBACTAM >=32 RESISTANT Resistant     PIP/TAZO 8 SENSITIVE Sensitive     Extended ESBL NEGATIVE Sensitive     * >=100,000 COLONIES/mL KLEBSIELLA PNEUMONIAE  Culture, blood (x 2)     Status: None (Preliminary result)   Collection Time: 06/30/19  4:30 PM   Specimen: BLOOD RIGHT HAND  Result Value Ref Range Status   Specimen Description BLOOD RIGHT HAND  Final   Special Requests   Final    BOTTLES DRAWN AEROBIC AND ANAEROBIC Blood Culture results may not be optimal due to an inadequate volume of blood received in culture bottles   Culture   Final    NO GROWTH 2 DAYS Performed at [facility], [address] [place], [place] [address]    Report Status PENDING  Incomplete  Culture, blood (x 2)     Status: None (Preliminary result)   Collection Time: 06/30/19  4:30 PM   Specimen: BLOOD LEFT HAND  Result Value Ref Range Status   Specimen Description BLOOD LEFT HAND  Final   Special Requests   Final    BOTTLES DRAWN AEROBIC ONLY Blood Culture results may not be optimal due to an inadequate volume of blood received in culture bottles   Culture   Final    NO GROWTH 2 DAYS Performed at [facility], [address] [place], [place] [address]    Report Status PENDING  Incomplete  MRSA PCR Screening     Status: Abnormal   Collection Time: 06/30/19  5:16 PM   Specimen: Nasal Mucosa; Nasopharyngeal  Result Value Ref Range Status  MRSA by PCR POSITIVE (A) NEGATIVE Final    Comment:        The GeneXpert MRSA Assay (FDA approved for NASAL specimens only), is one component of a comprehensive MRSA colonization surveillance program. It is not intended to diagnose MRSA infection nor to guide or monitor treatment for MRSA infections. RESULT CALLED TO, READ BACK BY AND VERIFIED WITH: BRANDY MCGEE @1834   06/30/19 MJU Performed at [facility], [address] [place], [place] [address]     RADIOLOGY:  Ct Head Wo Contrast  Result Date: 06/30/2019 CLINICAL DATA:  Sudden onset visual changes.  History of stroke. EXAM: CT HEAD WITHOUT CONTRAST TECHNIQUE: Contiguous axial images were obtained from the base of the skull through the vertex without intravenous contrast. COMPARISON:  MR brain dated August 30, 2018. CT head dated August 27, 2018. FINDINGS: Brain: No evidence of acute infarction, hemorrhage, hydrocephalus, extra-axial collection or mass lesion/mass effect. Right cerebellar encephalomalacia again noted. Interval placement of a right frontal approach ventriculostomy shunt with the tip in the left frontal horn. Although the bifrontal diameter is unchanged, there has been slight interval decrease in size of the lateral ventricle temporal and occipital horns, as well as the third ventricle. Vascular: Atherosclerotic vascular calcification of the carotid siphons. No hyperdense vessel. 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 Negative for fracture or focal lesion. Sinuses/Orbits: No acute finding. Other: None. IMPRESSION: 1. No acute intracranial abnormality. 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 2. Interval placement of a right frontal approach ventriculostomy shunt with decreased size of the lateral ventricle temporal and occipital horns as well as the third ventricle. 3. 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 Electronically Signed   By: Titus Dubin M.D.   On: 06/30/2019 18:51    EKG:   Orders placed or performed during the hospital encounter of 06/28/19  . EKG 12-Lead  . EKG 12-Lead  . EKG 12-Lead  . EKG 12-Lead  . EKG 12-Lead  . EKG 12-Lead      Management plans discussed with the patient, family and they are in agreement.  CODE STATUS:     Code Status Orders  (From admission,  onward)         Start     Ordered   06/29/19 0118  Full code  Continuous     06/29/19 0117        Code Status History    Date Active Date Inactive Code Status Order ID Comments User Context   08/27/2018 1533 08/31/2018 1544 Full Code [id number]  Henreitta Leber, MD ED   Advance Care Planning Activity      TOTAL TIME TAKING CARE OF THIS PATIENT: 35 minutes.  Discussed with his wife on phone.  Vaughan Basta M.D on 07/02/2019 at 1:46 PM  Between 7am to 6pm - Pager - [phone]  After 6pm go to [email or web address] - password [email or web address] [place] [facility]  Office  [phone]  CC: Primary care physician; Patient, No Pcp Per   Note: This dictation was prepared with Dragon dictation along with smaller phrase technology. Any transcriptional errors that result from this process are unintentional.

## 2019-07-02 NOTE — TOC Transition Note (Signed)
Transition of Care [facility]) - CM/SW Discharge Note   Patient Details  Name: Alan Newman MRN: [id number] Date of Birth: 1951/08/21  [facility] CM/SW Contact:  Beverly Sessions, RN Phone Number: 07/02/2019, 4:59 PM   Clinical Narrative:    Patient to be discharge home with resumption of hospice services through [facility]. Tim with [facility] notified of discharge  Wife notified of discharge, and states that they have all of the equipment they need in the home   EMS packet on chart.  Bedside RN notified    Final next level of care: Home w Hospice Care Barriers to Discharge: Barriers Resolved   Patient Goals and CMS Choice Patient states their goals for this hospitalization and ongoing recovery are:: Patient would like to return back home with home hospice services through [email or web address].gov Compare Post Acute Care list provided to:: Patient Represenative (must comment) Choice offered to / list presented to : Spouse  Discharge Placement                  Name of family member notified: Wife Patient and family notified of of transfer: 07/02/19  Discharge Plan and Services In-house Referral: Clinical Social Work   Post Acute Care Choice: Hospice          DME Arranged: N/A DME Agency: NA       HH Arranged: Therapist, sports, Nurse's Aide [place] Agency: Other - See comment([facility])        Social Determinants of Health (SDOH) Interventions     Readmission Risk Interventions No flowsheet data found.

## 2019-07-02 NOTE — Plan of Care (Signed)
  Problem: Education: Goal: Knowledge of General Education information will improve Description: Including pain rating scale, medication(s)/side effects and non-pharmacologic comfort measures Outcome: Adequate for Discharge   Problem: Health Behavior/Discharge Planning: Goal: Ability to manage health-related needs will improve Outcome: Adequate for Discharge   Problem: Clinical Measurements: Goal: Ability to maintain clinical measurements within normal limits will improve Outcome: Adequate for Discharge Goal: Will remain free from infection Outcome: Adequate for Discharge Goal: Diagnostic test results will improve Outcome: Adequate for Discharge Goal: Respiratory complications will improve Outcome: Adequate for Discharge Goal: Cardiovascular complication will be avoided Outcome: Adequate for Discharge   Problem: Activity: Goal: Risk for activity intolerance will decrease Outcome: Adequate for Discharge   Problem: Coping: Goal: Level of anxiety will decrease Outcome: Adequate for Discharge   Problem: Elimination: Goal: Will not experience complications related to bowel motility Outcome: Adequate for Discharge Goal: Will not experience complications related to urinary retention Outcome: Adequate for Discharge   Problem: Pain Managment: Goal: General experience of comfort will improve Outcome: Adequate for Discharge   Problem: Safety: Goal: Ability to remain free from injury will improve Outcome: Adequate for Discharge   Problem: Skin Integrity: Goal: Risk for impaired skin integrity will decrease Outcome: Adequate for Discharge   Problem: Acute Rehab PT Goals(only PT should resolve) Goal: Pt Will Go Supine/Side To Sit Outcome: Adequate for Discharge Goal: Patient Will Perform Sitting Balance Outcome: Adequate for Discharge Goal: Pt Will Transfer Bed To Chair/Chair To Bed Outcome: Adequate for Discharge   Problem: Increased Nutrient Needs (NI-5.1) Goal: Food  and/or nutrient delivery Description: Individualized approach for food/nutrient provision. Outcome: Adequate for Discharge

## 2019-07-05 LAB — CULTURE, BLOOD (ROUTINE X 2)
Culture: NO GROWTH
Culture: NO GROWTH

## 2019-07-17 ENCOUNTER — Other Ambulatory Visit: Payer: Self-pay

## 2019-07-17 ENCOUNTER — Emergency Department
Admission: EM | Admit: 2019-07-17 | Discharge: 2019-07-18 | Disposition: A | Discharge status: Home / Self Care | Payer: Medicare Other | Attending: Emergency Medicine | Admitting: Emergency Medicine

## 2019-07-17 DIAGNOSIS — N390 Urinary tract infection, site not specified: Secondary | ICD-10-CM | POA: Insufficient documentation

## 2019-07-17 DIAGNOSIS — Z794 Long term (current) use of insulin: Secondary | ICD-10-CM | POA: Diagnosis not present

## 2019-07-17 DIAGNOSIS — R319 Hematuria, unspecified: Secondary | ICD-10-CM

## 2019-07-17 DIAGNOSIS — Z8673 Personal history of transient ischemic attack (TIA), and cerebral infarction without residual deficits: Secondary | ICD-10-CM | POA: Diagnosis not present

## 2019-07-17 DIAGNOSIS — Z66 Do not resuscitate: Secondary | ICD-10-CM | POA: Diagnosis not present

## 2019-07-17 DIAGNOSIS — Z7982 Long term (current) use of aspirin: Secondary | ICD-10-CM | POA: Insufficient documentation

## 2019-07-17 DIAGNOSIS — E1165 Type 2 diabetes mellitus with hyperglycemia: Secondary | ICD-10-CM | POA: Insufficient documentation

## 2019-07-17 DIAGNOSIS — Z79899 Other long term (current) drug therapy: Secondary | ICD-10-CM | POA: Diagnosis not present

## 2019-07-17 DIAGNOSIS — R739 Hyperglycemia, unspecified: Secondary | ICD-10-CM

## 2019-07-17 DIAGNOSIS — I10 Essential (primary) hypertension: Secondary | ICD-10-CM | POA: Insufficient documentation

## 2019-07-17 LAB — URINALYSIS, ROUTINE W REFLEX MICROSCOPIC
Bilirubin Urine: NEGATIVE
Glucose, UA: >=500 mg/dL — AB
Ketones, ur: NEGATIVE mg/dL
Nitrite: POSITIVE — AB
Protein, ur: 30 mg/dL — AB
RBC / HPF: >50 RBC/hpf — ABNORMAL HIGH (ref 0–5)
Specific Gravity, Urine: 1.02 (ref 1.005–1.030)
WBC, UA: >50 WBC/hpf — ABNORMAL HIGH (ref 0–5)
pH: 6 (ref 5.0–8.0)

## 2019-07-17 LAB — BASIC METABOLIC PANEL
Anion gap: 9 (ref 5–15)
BUN: 22 mg/dL (ref 8–23)
CO2: 28 mmol/L (ref 22–32)
Calcium: 8.9 mg/dL (ref 8.9–10.3)
Chloride: 99 mmol/L (ref 98–111)
Creatinine, Ser: 0.88 mg/dL (ref 0.61–1.24)
GFR calc Af Amer: >60 mL/min (ref >60)
GFR calc non Af Amer: >60 mL/min (ref >60)
Glucose, Bld: 431 mg/dL — ABNORMAL HIGH (ref 70–99)
Potassium: 4.7 mmol/L (ref 3.5–5.1)
Sodium: 136 mmol/L (ref 135–145)

## 2019-07-17 LAB — CBC WITH DIFFERENTIAL/PLATELET
Abs Immature Granulocytes: 0 10*3/uL (ref 0.00–0.07)
Basophils Absolute: 0 10*3/uL (ref 0.0–0.1)
Basophils Relative: 1 %
Eosinophils Absolute: 0.3 10*3/uL (ref 0.0–0.5)
Eosinophils Relative: 8 %
HCT: 39.4 % (ref 39.0–52.0)
Hemoglobin: 12.6 g/dL — ABNORMAL LOW (ref 13.0–17.0)
Immature Granulocytes: 0 %
Lymphocytes Relative: 50 %
Lymphs Abs: 1.9 10*3/uL (ref 0.7–4.0)
MCH: 29.6 pg (ref 26.0–34.0)
MCHC: 32 g/dL (ref 30.0–36.0)
MCV: 92.7 fL (ref 80.0–100.0)
Monocytes Absolute: 0.3 10*3/uL (ref 0.1–1.0)
Monocytes Relative: 8 %
Neutro Abs: 1.3 10*3/uL — ABNORMAL LOW (ref 1.7–7.7)
Neutrophils Relative %: 33 %
Platelets: 164 10*3/uL (ref 150–400)
RBC: 4.25 MIL/uL (ref 4.22–5.81)
RDW: 14.2 % (ref 11.5–15.5)
WBC: 3.8 10*3/uL — ABNORMAL LOW (ref 4.0–10.5)
nRBC: 0 % (ref 0.0–0.2)

## 2019-07-17 MED ORDER — SODIUM CHLORIDE 0.9 % IV BOLUS
1000.0000 mL | Freq: Once | INTRAVENOUS | Status: AC
  Administered 2019-07-17: 1000 mL via INTRAVENOUS

## 2019-07-17 MED ORDER — SODIUM CHLORIDE 0.9 % IV SOLN
1.0000 g | Freq: Once | INTRAVENOUS | Status: AC
  Administered 2019-07-17: 1 g via INTRAVENOUS
  Filled 2019-07-17: qty 10

## 2019-07-17 MED ORDER — CEFUROXIME AXETIL 250 MG PO TABS: 250.0000 mg | ORAL_TABLET | Freq: Two times a day (BID) | ORAL | 0 refills | Status: AC

## 2019-07-17 NOTE — ED Notes (Signed)
Pt's wife [name] updated per pt's request.

## 2019-07-17 NOTE — ED Notes (Signed)
Pt given pillow.

## 2019-07-17 NOTE — ED Notes (Signed)
Secretary placing request for EMS transport home now.

## 2019-07-17 NOTE — Discharge Instructions (Addendum)
His urine looks like he may have a UTI.  We are giving a course of antibiotics.  There is nothing emergent to do about blood in the urine unless he stops urinating.  He should follow-up with urology as needed.  He may need a cystoscopy to make sure that he has no mass in his bladder.    His sugar was also elevated but this could be secondary to the infection.  You can go up on his insulin 5 units and continue to closely monitor.  If he develops vomiting, fevers or any other concerns you can return to the ER.    Otherwise return to the ER for fevers, any other concerns.

## 2019-07-17 NOTE — ED Triage Notes (Signed)
Pt in via EMS from home. Wife has been seeing "blood" in urine for a week. 98.6; 484 BG; 136/93 BP; 66HR; 96% RA with EMS. Bedbound; history of stroke per EMS. A&Ox4 per EMS.

## 2019-07-17 NOTE — ED Notes (Addendum)
Pt denies pain; denies burning upon urination; states urine has become darker again; states seen about two weeks ago in ER for CP/BP issues/blood in urine. States got better at home then worse again. Denies issues with condom cath. Denies dec or inc urination. Pt has about 300cc in cath bag. Emptied.

## 2019-07-17 NOTE — ED Provider Notes (Signed)
[facility] Emergency Department Provider Note  ____________________________________________   First MD Initiated Contact with Patient 07/17/19 2049     (approximate)  I have reviewed the triage vital signs and the nursing notes.   HISTORY  Chief Complaint hematuria  HPI MOIZ RYANT is a 68 y.o. male with diabetes, hypertension, prior stroke who presents from home.  Patient has noticed blood in the urine for about a week.  The blood is intermittent, mild, occasionally some clots per patient, nothing makes it better, nothing makes it worse.  He denies any fevers or flank pain or abdominal pain.  He has a condom catheter on.  He says it was last changed today.          Past Medical History:  Diagnosis Date  . Diabetes mellitus without complication ([place])   . Hypertension   . Stroke [facility])     Patient Active Problem List   Diagnosis Date Noted  . Goals of care, counseling/discussion   . Palliative care by specialist   . DNR (do not resuscitate) discussion   . Chest pain 06/29/2019  . HTN (hypertension) 06/29/2019  . Diabetes ([place]) 06/29/2019  . Hyperglycemia 06/29/2019  . Protein-calorie malnutrition, severe 08/30/2018  . Acute renal failure (ARF) ([place]) 08/27/2018    Past Surgical History:  Procedure Laterality Date  . BRAIN SURGERY    . CARDIAC SURGERY      Prior to Admission medications   Medication Sig Start Date End Date Taking? Authorizing Provider  aspirin 81 MG chewable tablet Chew 1 tablet (81 mg total) by mouth daily. 07/02/19   Vaughan Basta, MD  atorvastatin (LIPITOR) 40 MG tablet Take 1 tablet (40 mg total) by mouth daily at 6 PM. 07/02/19   Vaughan Basta, MD  hyoscyamine (LEVSIN) 0.125 MG tablet Take 0.125 mg by mouth every 6 (six) hours as needed for cramping.    [provider]  insulin glargine (LANTUS) 100 unit/mL SOPN Inject 0.15 mLs (15 Units total) into the skin daily. 07/02/19   Vaughan Basta, MD  lisinopril (ZESTRIL) 10 MG tablet Take 10 mg by mouth 2 (two) times daily.    [provider]  LORazepam (ATIVAN) 0.5 MG tablet Take 0.5 mg by mouth every 4 (four) hours as needed for anxiety.    [provider]  metoprolol tartrate (LOPRESSOR) 50 MG tablet Take 1 tablet (50 mg total) by mouth 2 (two) times daily. 07/02/19   Vaughan Basta, MD  ondansetron (ZOFRAN) 4 MG tablet Take 4 mg by mouth every 6 (six) hours as needed for nausea or vomiting.    [provider]  ondansetron (ZOFRAN-ODT) 4 MG disintegrating tablet Take 4 mg by mouth every 6 (six) hours as needed for nausea or vomiting.    [provider]  tamsulosin (FLOMAX) 0.4 MG CAPS capsule Take 0.4 mg by mouth at bedtime.    [provider]  traZODone (DESYREL) 50 MG tablet Take 50 mg by mouth at bedtime.    [provider]    Allergies Patient has no known allergies.  Family History  Problem Relation Age of Onset  . Kidney disease Mother   . Dementia Father   . Cancer Father   . Stomach cancer Sister     Social History Social History   Tobacco Use  . Smoking status: Never Smoker  . Smokeless tobacco: Never Used  Substance Use Topics  . Alcohol use: Never    Frequency: Never  . Drug use:  Never      Review of Systems Constitutional: No fever/chills Eyes: No visual changes. ENT: No sore throat. Cardiovascular: Denies chest pain. Respiratory: Denies shortness of breath. Gastrointestinal: No abdominal pain.  No nausea, no vomiting.  No diarrhea.  No constipation. Genitourinary: Negative for dysuria.  Hematuria Musculoskeletal: Negative for back pain. Skin: Negative for rash. Neurological: Negative for headaches, focal weakness or numbness. All other ROS negative ____________________________________________   PHYSICAL EXAM:  VITAL SIGNS: Blood pressure (!) 152/98, pulse (!) 58, temperature 97.8 F (36.6 C), temperature source  Oral, height 5\' 9"  (1.753 m), weight 72.6 kg, SpO2 98 %.   Constitutional: Alert and oriented. Well appearing and in no acute distress. Eyes: Conjunctivae are normal. EOMI. Head: Atraumatic. Nose: No congestion/rhinnorhea. Mouth/Throat: Mucous membranes are moist.   Neck: No stridor. Trachea Midline. FROM Cardiovascular: Normal rate, regular rhythm. Grossly normal heart sounds.  Good peripheral circulation. Respiratory: Normal respiratory effort.  No retractions. Lungs CTAB. Gastrointestinal: Soft and nontender. No distention. No abdominal bruits.  Musculoskeletal: No lower extremity tenderness nor edema.  No joint effusions. Neurologic:  Normal speech and language. No gross focal neurologic deficits are appreciated.  Skin:  Skin is warm, dry and intact. No rash noted. Psychiatric: Mood and affect are normal. Speech and behavior are normal. GU: Condom catheter in place with yellow urine.  No evidence of hematuria at this time.  No tenderness to his testicles.  ____________________________________________   LABS (all labs ordered are listed, but only abnormal results are displayed)  Labs Reviewed  CBC WITH DIFFERENTIAL/PLATELET - Abnormal; Notable for the following components:      Result Value   WBC 3.8 (*)    Hemoglobin 12.6 (*)    Neutro Abs 1.3 (*)    All other components within normal limits  BASIC METABOLIC PANEL - Abnormal; Notable for the following components:   Glucose, Bld 431 (*)    All other components within normal limits  URINALYSIS, ROUTINE W REFLEX MICROSCOPIC - Abnormal; Notable for the following components:   Color, Urine YELLOW (*)    APPearance CLOUDY (*)    Glucose, UA >=500 (*)    Hgb urine dipstick LARGE (*)    Protein, ur 30 (*)    Nitrite POSITIVE (*)    Leukocytes,Ua LARGE (*)    RBC / HPF >50 (*)    WBC, UA >50 (*)    Bacteria, UA FEW (*)    All other components within normal limits  URINE CULTURE   ____________________________________________   INITIAL IMPRESSION / ASSESSMENT AND PLAN / ED COURSE  was evaluated in Emergency Department on 07/17/2019 for the symptoms described in the history of present illness. He was evaluated in the context of the global COVID-19 pandemic, which necessitated consideration that the patient might be at risk for infection with the SARS-CoV-2 virus that causes COVID-19. Institutional protocols and algorithms that pertain to the evaluation of patients at risk for COVID-19 are in a state of rapid change based on information released by regulatory bodies including the CDC and federal and state organizations. These policies and algorithms were followed during the patient's care in the ED.    Patient presents with intermittent hematuria for the past 1 week.  Patient has no hematuria on exam at this time.  Patient's urine looks slightly cloudy but not bloody.  Will get labs to evaluate for leukocytosis, AKI as well as urine to evaluate for UTI.  Lower suspicion for kidney stone given patient is in  no pain.  Will do bladder scan as well.   White count slightly low at 3.8 but otherwise no other sirs criteria and patient does not appear septic or bacteremic.  Kidney function is normal.  No large blood clots to suggest urinary retention.  Patient's been having good urine output.  Urine is consistent with a UTI with microscopic hematuria.  Therefore will give dose of ceftriaxone and send patient home on cefuroxime given prior culture data.  Sugar is slightly elevated at 431 but no evidence of anion gap elevation to suggest DKA.  Will give 1 L and recheck. The elevation is most likely secondary to the infection.  Given patient is asymptomatic encouraged patient to go up on his insulin and keep a close eye on his blood sugars.  And to return if he develops vomiting or any other concerns.  I discussed the provisional nature of ED diagnosis, the treatment so far, the ongoing plan of care, follow up  appointments and return precautions with the patient and any family or support people present. They expressed understanding and agreed with the plan, discharged home.     ____________________________________________   FINAL CLINICAL IMPRESSION(S) / ED DIAGNOSES   Final diagnoses:  Urinary tract infection with hematuria, site unspecified  Hyperglycemia      MEDICATIONS GIVEN DURING THIS VISIT:  Medications  cefTRIAXone (ROCEPHIN) 1 g in sodium chloride 0.9 % 100 mL IVPB (0 g Intravenous Stopped 07/17/19 2254)     ED Discharge Orders         Ordered    cefUROXime (CEFTIN) 250 MG tablet  2 times daily     07/17/19 2251           Note:  This document was prepared using Dragon voice recognition software and may include unintentional dictation errors.   Concha SeFunke, Miryah Ralls E, MD 07/17/19 2255

## 2019-07-17 NOTE — ED Notes (Signed)
Pt given warm blanket.

## 2019-07-17 NOTE — ED Notes (Signed)
BG to be checked again following completion of NS bolus.

## 2019-07-17 NOTE — ED Notes (Signed)
Patient resting quietly. Lights out in room for patient comfort. Patient's IV fluid bolus running and IV site is maintained. Disposition explained to patient and plan for discharge after fluids and CBG recheck.

## 2019-07-18 LAB — GLUCOSE, CAPILLARY: Glucose-Capillary: 327 mg/dL — ABNORMAL HIGH (ref 70–99)

## 2019-07-18 NOTE — ED Notes (Signed)
Patient rang out to ask that his "diaper be repositioned." Patient had small BM that required a diaper change. Patient's bottom cleaned with wet wipe and clean diaper placed over condom cath. Patient repositioned in bed for comfort. Reoriented to call light. IV fluid bolus continues and patient is tolerating well. Will continue to monitor.

## 2019-07-18 NOTE — ED Notes (Signed)
Patient up for discharge. Patient will require EMS transport back to his residence.

## 2019-07-18 NOTE — ED Notes (Signed)
EMS here to transport patient back home. Patient alert and oriented at this time.

## 2019-07-21 LAB — URINE CULTURE: Culture: >=100000 — AB

## 2019-07-22 NOTE — Progress Notes (Signed)
Brief Pharmacy Note  68 y/o M with history of stroke, diabetes, HTN, prior stroke who presented to [facility] ED c/o hematuria x 1 week. At that time he denied fevers, flank pain, or abdominal pain. Patient endorsed wearing a condom catheter at home. Discharged on cefuroxime x 10 days.   Urine culture result from ED visit resulted >100k colonies/mL K pneumoniae and >100k colonies/mL PSA. Patient has NKDA on file. Spoke with EDP regarding culture results and received verbal prescription for ciprofloxacin 500 mg BID x 5 days.   Phoned number on file for patient and reached his wife who stated that he is unavailable and that she is his caregiver. Wife denied patient is experiencing urinary symptoms, hematuria, abdominal pain, fevers. Shared results of urine culture with spouse. She states that patient has frequent UTIs and has been admitted previously for sepsis secondary from urinary infections. She reports that patient is non-mobile which is why he wears a condom catheter. Considering patient status, history of malnutrition, and un-controlled diabetes he is likely immunocompromised. Called in aforementioned prescription to patient's pharmacy. Informed wife to stop current antibiotic and start new prescription.

## 2019-09-10 ENCOUNTER — Observation Stay
Admission: EM | Admit: 2019-09-10 | Discharge: 2019-09-11 | Disposition: A | Discharge status: Home / Self Care | Payer: Medicare Other | Attending: Internal Medicine | Admitting: Internal Medicine

## 2019-09-10 ENCOUNTER — Encounter: Payer: Self-pay | Admitting: Emergency Medicine

## 2019-09-10 ENCOUNTER — Other Ambulatory Visit: Payer: Self-pay

## 2019-09-10 DIAGNOSIS — Z8673 Personal history of transient ischemic attack (TIA), and cerebral infarction without residual deficits: Secondary | ICD-10-CM | POA: Diagnosis not present

## 2019-09-10 DIAGNOSIS — R1110 Vomiting, unspecified: Secondary | ICD-10-CM | POA: Insufficient documentation

## 2019-09-10 DIAGNOSIS — E1165 Type 2 diabetes mellitus with hyperglycemia: Secondary | ICD-10-CM | POA: Diagnosis not present

## 2019-09-10 DIAGNOSIS — R112 Nausea with vomiting, unspecified: Secondary | ICD-10-CM | POA: Diagnosis not present

## 2019-09-10 DIAGNOSIS — I10 Essential (primary) hypertension: Secondary | ICD-10-CM | POA: Insufficient documentation

## 2019-09-10 DIAGNOSIS — Z7982 Long term (current) use of aspirin: Secondary | ICD-10-CM | POA: Diagnosis not present

## 2019-09-10 DIAGNOSIS — D72829 Elevated white blood cell count, unspecified: Secondary | ICD-10-CM | POA: Diagnosis not present

## 2019-09-10 DIAGNOSIS — Z7401 Bed confinement status: Secondary | ICD-10-CM | POA: Insufficient documentation

## 2019-09-10 DIAGNOSIS — Z79899 Other long term (current) drug therapy: Secondary | ICD-10-CM | POA: Diagnosis not present

## 2019-09-10 DIAGNOSIS — N3001 Acute cystitis with hematuria: Secondary | ICD-10-CM

## 2019-09-10 DIAGNOSIS — Z794 Long term (current) use of insulin: Secondary | ICD-10-CM | POA: Insufficient documentation

## 2019-09-10 DIAGNOSIS — E860 Dehydration: Secondary | ICD-10-CM | POA: Diagnosis not present

## 2019-09-10 DIAGNOSIS — N390 Urinary tract infection, site not specified: Secondary | ICD-10-CM | POA: Diagnosis present

## 2019-09-10 DIAGNOSIS — Z20828 Contact with and (suspected) exposure to other viral communicable diseases: Secondary | ICD-10-CM | POA: Insufficient documentation

## 2019-09-10 LAB — CBC WITH DIFFERENTIAL/PLATELET
Abs Immature Granulocytes: 0.08 10*3/uL — ABNORMAL HIGH (ref 0.00–0.07)
Basophils Absolute: 0.1 10*3/uL (ref 0.0–0.1)
Basophils Relative: 0 %
Eosinophils Absolute: 0 10*3/uL (ref 0.0–0.5)
Eosinophils Relative: 0 %
HCT: 40.9 % (ref 39.0–52.0)
Hemoglobin: 13.7 g/dL (ref 13.0–17.0)
Immature Granulocytes: 1 %
Lymphocytes Relative: 12 %
Lymphs Abs: 1.7 10*3/uL (ref 0.7–4.0)
MCH: 29.7 pg (ref 26.0–34.0)
MCHC: 33.5 g/dL (ref 30.0–36.0)
MCV: 88.5 fL (ref 80.0–100.0)
Monocytes Absolute: 1.3 10*3/uL — ABNORMAL HIGH (ref 0.1–1.0)
Monocytes Relative: 9 %
Neutro Abs: 11.5 10*3/uL — ABNORMAL HIGH (ref 1.7–7.7)
Neutrophils Relative %: 78 %
Platelets: 307 10*3/uL (ref 150–400)
RBC: 4.62 MIL/uL (ref 4.22–5.81)
RDW: 14.6 % (ref 11.5–15.5)
WBC: 14.6 10*3/uL — ABNORMAL HIGH (ref 4.0–10.5)
nRBC: 0 % (ref 0.0–0.2)

## 2019-09-10 LAB — COMPREHENSIVE METABOLIC PANEL
ALT: 40 U/L (ref 0–44)
AST: 19 U/L (ref 15–41)
Albumin: 3.4 g/dL — ABNORMAL LOW (ref 3.5–5.0)
Alkaline Phosphatase: 327 U/L — ABNORMAL HIGH (ref 38–126)
Anion gap: 12 (ref 5–15)
BUN: 16 mg/dL (ref 8–23)
CO2: 30 mmol/L (ref 22–32)
Calcium: 9 mg/dL (ref 8.9–10.3)
Chloride: 95 mmol/L — ABNORMAL LOW (ref 98–111)
Creatinine, Ser: 0.81 mg/dL (ref 0.61–1.24)
GFR calc Af Amer: >60 mL/min (ref >60)
GFR calc non Af Amer: >60 mL/min (ref >60)
Glucose, Bld: 223 mg/dL — ABNORMAL HIGH (ref 70–99)
Potassium: 4.3 mmol/L (ref 3.5–5.1)
Sodium: 137 mmol/L (ref 135–145)
Total Bilirubin: 1.1 mg/dL (ref 0.3–1.2)
Total Protein: 7.1 g/dL (ref 6.5–8.1)

## 2019-09-10 LAB — RESPIRATORY PANEL BY RT PCR (FLU A&B, COVID)
Influenza A by PCR: NEGATIVE
Influenza B by PCR: NEGATIVE
SARS Coronavirus 2 by RT PCR: NEGATIVE

## 2019-09-10 LAB — URINALYSIS, COMPLETE (UACMP) WITH MICROSCOPIC
Bilirubin Urine: NEGATIVE
Glucose, UA: 50 mg/dL — AB
Ketones, ur: 5 mg/dL — AB
Nitrite: NEGATIVE
Protein, ur: 100 mg/dL — AB
RBC / HPF: >50 RBC/hpf — ABNORMAL HIGH (ref 0–5)
Specific Gravity, Urine: 1.021 (ref 1.005–1.030)
Squamous Epithelial / HPF: NONE SEEN (ref 0–5)
WBC, UA: >50 WBC/hpf — ABNORMAL HIGH (ref 0–5)
pH: 6 (ref 5.0–8.0)

## 2019-09-10 LAB — TROPONIN I (HIGH SENSITIVITY)
Troponin I (High Sensitivity): 5 ng/L (ref <18)
Troponin I (High Sensitivity): 5 ng/L (ref <18)

## 2019-09-10 LAB — HEMOGLOBIN A1C
Hgb A1c MFr Bld: 9.5 % — ABNORMAL HIGH (ref 4.8–5.6)
Mean Plasma Glucose: 225.95 mg/dL

## 2019-09-10 LAB — GLUCOSE, CAPILLARY: Glucose-Capillary: 209 mg/dL — ABNORMAL HIGH (ref 70–99)

## 2019-09-10 MED ORDER — ACETAMINOPHEN 325 MG PO TABS: 650.0000 mg | ORAL_TABLET | Freq: Four times a day (QID) | ORAL | Status: DC | PRN

## 2019-09-10 MED ORDER — HYOSCYAMINE SULFATE 0.125 MG PO TABS
0.1250 mg | ORAL_TABLET | Freq: Four times a day (QID) | ORAL | Status: DC | PRN
  Filled 2019-09-10: qty 1

## 2019-09-10 MED ORDER — ONDANSETRON HCL 4 MG/2ML IJ SOLN
4.0000 mg | Freq: Once | INTRAMUSCULAR | Status: AC
  Administered 2019-09-10: 16:00:00 4 mg via INTRAVENOUS
  Filled 2019-09-10: qty 2

## 2019-09-10 MED ORDER — LISINOPRIL 10 MG PO TABS
10.0000 mg | ORAL_TABLET | Freq: Two times a day (BID) | ORAL | Status: DC
  Administered 2019-09-10 – 2019-09-11 (×2): 10 mg via ORAL
  Filled 2019-09-10 (×2): qty 1

## 2019-09-10 MED ORDER — ONDANSETRON HCL 4 MG PO TABS: 4.0000 mg | ORAL_TABLET | Freq: Four times a day (QID) | ORAL | Status: DC | PRN

## 2019-09-10 MED ORDER — SODIUM CHLORIDE 0.9% FLUSH: 10.0000 mL | INTRAVENOUS | Status: DC | PRN

## 2019-09-10 MED ORDER — ASPIRIN 81 MG PO CHEW
81.0000 mg | CHEWABLE_TABLET | Freq: Every day | ORAL | Status: DC
  Administered 2019-09-11: 81 mg via ORAL
  Filled 2019-09-10: qty 1

## 2019-09-10 MED ORDER — SODIUM CHLORIDE 0.9 % IV SOLN
1000.0000 mL | Freq: Once | INTRAVENOUS | Status: AC
  Administered 2019-09-10: 16:00:00 1000 mL via INTRAVENOUS

## 2019-09-10 MED ORDER — LORAZEPAM 0.5 MG PO TABS: 0.5000 mg | ORAL_TABLET | ORAL | Status: DC | PRN

## 2019-09-10 MED ORDER — CIPROFLOXACIN IN D5W 400 MG/200ML IV SOLN
400.0000 mg | Freq: Two times a day (BID) | INTRAVENOUS | Status: DC
  Administered 2019-09-11: 09:00:00 400 mg via INTRAVENOUS
  Filled 2019-09-10 (×2): qty 200

## 2019-09-10 MED ORDER — INSULIN ASPART 100 UNIT/ML ~~LOC~~ SOLN
0.0000 [IU] | Freq: Every day | SUBCUTANEOUS | Status: DC
  Administered 2019-09-10: 2 [IU] via SUBCUTANEOUS
  Filled 2019-09-10: qty 1

## 2019-09-10 MED ORDER — LABETALOL HCL 5 MG/ML IV SOLN
10.0000 mg | Freq: Four times a day (QID) | INTRAVENOUS | Status: DC | PRN
  Administered 2019-09-10: 19:00:00 10 mg via INTRAVENOUS
  Filled 2019-09-10: qty 4

## 2019-09-10 MED ORDER — TAMSULOSIN HCL 0.4 MG PO CAPS
0.4000 mg | ORAL_CAPSULE | Freq: Every day | ORAL | Status: DC
  Administered 2019-09-10: 0.4 mg via ORAL
  Filled 2019-09-10: qty 1

## 2019-09-10 MED ORDER — ACETAMINOPHEN 650 MG RE SUPP: 650.0000 mg | Freq: Four times a day (QID) | RECTAL | Status: DC | PRN

## 2019-09-10 MED ORDER — SODIUM CHLORIDE 0.9 % IV SOLN: Freq: Once | INTRAVENOUS | Status: AC

## 2019-09-10 MED ORDER — TRAZODONE HCL 50 MG PO TABS
50.0000 mg | ORAL_TABLET | Freq: Every day | ORAL | Status: DC
  Administered 2019-09-10: 50 mg via ORAL
  Filled 2019-09-10: qty 1

## 2019-09-10 MED ORDER — MORPHINE SULFATE ER 30 MG PO TBCR
30.0000 mg | EXTENDED_RELEASE_TABLET | Freq: Two times a day (BID) | ORAL | Status: DC
  Administered 2019-09-10 – 2019-09-11 (×2): 30 mg via ORAL
  Filled 2019-09-10 (×2): qty 1

## 2019-09-10 MED ORDER — INSULIN ASPART 100 UNIT/ML ~~LOC~~ SOLN
0.0000 [IU] | Freq: Three times a day (TID) | SUBCUTANEOUS | Status: DC
  Administered 2019-09-11 (×2): 2 [IU] via SUBCUTANEOUS
  Administered 2019-09-11: 12:00:00 5 [IU] via SUBCUTANEOUS
  Filled 2019-09-10 (×3): qty 1

## 2019-09-10 MED ORDER — ENOXAPARIN SODIUM 40 MG/0.4ML ~~LOC~~ SOLN
40.0000 mg | SUBCUTANEOUS | Status: DC
  Administered 2019-09-10: 40 mg via SUBCUTANEOUS
  Filled 2019-09-10: qty 0.4

## 2019-09-10 MED ORDER — SODIUM CHLORIDE 0.9% FLUSH
10.0000 mL | Freq: Two times a day (BID) | INTRAVENOUS | Status: DC
  Administered 2019-09-10 – 2019-09-11 (×3): 10 mL

## 2019-09-10 MED ORDER — CIPROFLOXACIN IN D5W 400 MG/200ML IV SOLN
400.0000 mg | Freq: Once | INTRAVENOUS | Status: AC
  Administered 2019-09-10: 16:00:00 400 mg via INTRAVENOUS
  Filled 2019-09-10: qty 200

## 2019-09-10 NOTE — ED Provider Notes (Signed)
[facility] Emergency Department Provider Note       Time seen: ----------------------------------------- 1:12 PM on 09/10/2019 -----------------------------------------  I have reviewed the triage vital signs and the nursing notes.  HISTORY   Chief Complaint No chief complaint on file.   HPI Alan Newman is a 68 y.o. male with a history of diabetes, hypertension, CVA, renal failure who presents to the ED for possible UTI.  Patient has been having vomiting and cannot keep anything down.  His urine was noted to be foul appearing.  Currently is under hospice care, is a DNR.  Past Medical History:  Diagnosis Date  . Diabetes mellitus without complication ([facility])   . Hypertension   . Stroke [facility])     Patient Active Problem List   Diagnosis Date Noted  . Goals of care, counseling/discussion   . Palliative care by specialist   . DNR (do not resuscitate) discussion   . Chest pain 06/29/2019  . HTN (hypertension) 06/29/2019  . Diabetes ([facility]) 06/29/2019  . Hyperglycemia 06/29/2019  . Protein-calorie malnutrition, severe 08/30/2018  . Acute renal failure (ARF) ([facility]) 08/27/2018    Past Surgical History:  Procedure Laterality Date  . BRAIN SURGERY    . CARDIAC SURGERY      Allergies Patient has no known allergies.  Social History Social History   Tobacco Use  . Smoking status: Never Smoker  . Smokeless tobacco: Never Used  Substance Use Topics  . Alcohol use: Never  . Drug use: Never   Review of Systems Constitutional: Negative for fever. Cardiovascular: Negative for chest pain. Respiratory: Negative for shortness of breath. Gastrointestinal: Negative for abdominal pain, positive for vomiting Musculoskeletal: Negative for back pain. Skin: Negative for rash. Neurological: Negative for headaches, positive for weakness  All systems negative/normal/unremarkable except as stated in the  HPI  ____________________________________________   PHYSICAL EXAM:  VITAL SIGNS: ED Triage Vitals  Enc Vitals Group     BP      Pulse      Resp      Temp      Temp src      SpO2      Weight      Height      Head Circumference      Peak Flow      Pain Score      Pain Loc      Pain Edu?      Excl. in GC?    Constitutional: Alert and oriented.  Chronically ill-appearing, no distress Eyes: Conjunctivae are normal. Normal extraocular movements. ENT      Head: Normocephalic and atraumatic.      Nose: No congestion/rhinnorhea.      Mouth/Throat: Mucous membranes are moist.      Neck: No stridor. Cardiovascular: Normal rate, regular rhythm. No murmurs, rubs, or gallops. Respiratory: Normal respiratory effort without tachypnea nor retractions. Breath sounds are clear and equal bilaterally. No wheezes/rales/rhonchi. Gastrointestinal: Soft and nontender. Normal bowel sounds Musculoskeletal: Nontender with normal range of motion in extremities. No lower extremity tenderness nor edema. Neurologic:  No gross focal neurologic deficits are appreciated.  Generalized weakness, nothing focal Skin:  Skin is warm, dry and intact. No rash noted. Psychiatric: Flat affect ____________________________________________  EKG: Interpreted by me.  Sinus rhythm with rate of 88 bpm, low voltage, long QT, normal axis  ____________________________________________  ED COURSE:  As part of my medical decision making, I reviewed the following data within the electronic MEDICAL RECORD NUMBER History obtained from  family if available, nursing notes, old chart and ekg, as well as notes from prior ED visits. Patient presented for weakness, vomiting and possible UTI, we will assess with labs and imaging as indicated at this time.   Procedures  Alan Newman was evaluated in Emergency Department on 09/10/2019 for the symptoms described in the history of present illness. He was evaluated in the context of the  global COVID-19 pandemic, which necessitated consideration that the patient might be at risk for infection with the SARS-CoV-2 virus that causes COVID-19. Institutional protocols and algorithms that pertain to the evaluation of patients at risk for COVID-19 are in a state of rapid change based on information released by regulatory bodies including the CDC and federal and state organizations. These policies and algorithms were followed during the patient's care in the ED.  ____________________________________________   LABS (pertinent positives/negatives)  Labs Reviewed  URINALYSIS, COMPLETE (UACMP) WITH MICROSCOPIC - Abnormal; Notable for the following components:      Result Value   Color, Urine AMBER (*)    APPearance TURBID (*)    Glucose, UA 50 (*)    Hgb urine dipstick LARGE (*)    Ketones, ur 5 (*)    Protein, ur 100 (*)    Leukocytes,Ua LARGE (*)    RBC / HPF >50 (*)    WBC, UA >50 (*)    Bacteria, UA RARE (*)    All other components within normal limits  RESPIRATORY PANEL BY RT PCR (FLU A&B, COVID)  URINE CULTURE  CBC WITH DIFFERENTIAL/PLATELET  COMPREHENSIVE METABOLIC PANEL  TROPONIN I (HIGH SENSITIVITY)  TROPONIN I (HIGH SENSITIVITY)  ____________________________________________   DIFFERENTIAL DIAGNOSIS   Gastroenteritis, dehydration, electrolyte abnormality, sepsis, UTI, COVID-19  FINAL ASSESSMENT AND PLAN  Vomiting, UTI   Plan: The patient had presented for vomiting, poor p.o. intake, possible UTI. Patient's labs do indicate a significant urinary tract infection.  His previous urine infection grew out Klebsiella and Pseudomonas sensitive to Cipro.  He was given IV Cipro here after fluids and Zofran.  Final disposition is pending at this time.   Laurence Aly, MD    Note: This note was generated in part or whole with voice recognition software. Voice recognition is usually quite accurate but there are transcription errors that can and very often do occur. I  apologize for any typographical errors that were not detected and corrected.     Earleen Newport, MD 09/10/19 [id number]

## 2019-09-10 NOTE — ED Triage Notes (Signed)
Patient from home via [facility]. Per EMS, patient has had nausea and vomiting for several days and is unable to keep anything down. Patient also has dark urine with history of frequent UTIs.

## 2019-09-10 NOTE — ED Notes (Signed)
IV team at bedside 

## 2019-09-10 NOTE — ED Notes (Signed)
2 unsuccessful attempt at IV by this RN. IV team consult placed.

## 2019-09-10 NOTE — H&P (Signed)
[facility] [place] NAME: Alan Newman    MR#:  [id number]  DATE OF BIRTH:  [birth year]  DATE OF ADMISSION:  09/10/2019  PRIMARY CARE PHYSICIAN: Patient, No Pcp Per   REQUESTING/REFERRING PHYSICIAN: 09/10/2019  Patient coming from :Home with wife. Followed by Hospice at home.   CHIEF COMPLAINT:  weakness nausea and vomiting per patient advised  HISTORY OF PRESENT ILLNESS:  Alan Newman  is a 68 y.o. male with a known history of stroke in the past who is bedbound does not walk, diabetes, hypertension followed by hospice at home comes to the emergency room with poor PO intake last couple days along with nausea and vomiting. Last vomiting was this morning. He is not vomiting in the ER. Patient denies any fever abdominal pain. Since patient is bedbound he wears a chronic condom catheter.  ED course: in the ER patient's blood pressure was elevated, he received Zofran. He feels hungry wants regular food. He was found to have UTI and received IV Cipro based on previous urine culture in October 2020. Patient is being admitted for further evaluation management.  PAST MEDICAL HISTORY:   Past Medical History:  Diagnosis Date  . Diabetes mellitus without complication ([facility])   . Hypertension   . Stroke [facility])     PAST SURGICAL HISTOIRY:   Past Surgical History:  Procedure Laterality Date  . BRAIN SURGERY    . CARDIAC SURGERY      SOCIAL HISTORY:   Social History   Tobacco Use  . Smoking status: Never Smoker  . Smokeless tobacco: Never Used  Substance Use Topics  . Alcohol use: Never    FAMILY HISTORY:   Family History  Problem Relation Age of Onset  . Kidney disease Mother   . Dementia Father   . Cancer Father   . Stomach cancer Sister     DRUG ALLERGIES:  No Known Allergies  REVIEW OF SYSTEMS:  Review of Systems  Constitutional: Negative for chills, fever and weight loss.  HENT: Negative for ear discharge, ear pain  and nosebleeds.   Eyes: Negative for blurred vision, pain and discharge.  Respiratory: Negative for sputum production, shortness of breath, wheezing and stridor.   Cardiovascular: Negative for chest pain, palpitations, orthopnea and PND.  Gastrointestinal: Positive for nausea and vomiting. Negative for abdominal pain and diarrhea.  Genitourinary: Negative for frequency and urgency.  Musculoskeletal: Negative for back pain and joint pain.  Neurological: Positive for weakness. Negative for sensory change, speech change and focal weakness.  Psychiatric/Behavioral: Negative for depression and hallucinations. The patient is not nervous/anxious.      MEDICATIONS AT HOME:   Prior to Admission medications   Medication Sig Start Date End Date Taking? Authorizing Provider  aspirin 81 MG chewable tablet Chew 1 tablet (81 mg total) by mouth daily. 07/02/19  Yes Vaughan Basta, MD  hyoscyamine (LEVSIN) 0.125 MG tablet Take 0.125 mg by mouth every 6 (six) hours as needed for cramping.   Yes [provider]  insulin glargine (LANTUS) 100 unit/mL SOPN Inject 0.15 mLs (15 Units total) into the skin daily. 07/02/19  Yes Vaughan Basta, MD  lisinopril (ZESTRIL) 10 MG tablet Take 10 mg by mouth 2 (two) times daily.   Yes [provider]  LORazepam (ATIVAN) 0.5 MG tablet Take 0.5 mg by mouth every 4 (four) hours as needed for anxiety.   Yes [provider]  metFORMIN (GLUCOPHAGE) 1000 MG tablet Take 1,000 mg by mouth daily.  Yes [provider]  morphine (MS CONTIN) 30 MG 12 hr tablet Take 30 mg by mouth 2 (two) times daily. 08/31/19  Yes [provider]  ondansetron (ZOFRAN) 4 MG tablet Take 4 mg by mouth every 6 (six) hours as needed for nausea or vomiting.   Yes [provider]  ondansetron (ZOFRAN-ODT) 4 MG disintegrating tablet Take 4 mg by mouth every 6 (six) hours as needed for nausea or vomiting.   Yes [provider]  tamsulosin  (FLOMAX) 0.4 MG CAPS capsule Take 0.4 mg by mouth at bedtime.   Yes [provider]  traZODone (DESYREL) 50 MG tablet Take 50 mg by mouth at bedtime.   Yes [provider]      VITAL SIGNS:  Blood pressure (!) 173/114, pulse 96, temperature 99.6 F (37.6 C), temperature source Oral, resp. rate 18, height 5\' 9"  (1.753 m), weight 77.1 kg, SpO2 94 %.  PHYSICAL EXAMINATION:  GENERAL:  68 y.o.-year-old patient lying in the bed with no acute distress.  EYES: Pupils equal, round, reactive to light and accommodation. No scleral icterus.  HEENT: Head atraumatic, normocephalic. Oropharynx and nasopharynx clear.  NECK:  Supple, no jugular venous distention. No thyroid enlargement, no tenderness.  LUNGS: Normal breath sounds bilaterally, no wheezing, rales,rhonchi or crepitation. No use of accessory muscles of respiration.  CARDIOVASCULAR: S1, S2 normal. No murmurs, rubs, or gallops.  ABDOMEN: Soft, nontender, nondistended. Bowel sounds present. No organomegaly or mass.  Condom catheter EXTREMITIES: No pedal edema, cyanosis, or clubbing.  NEUROLOGIC: is all extremities well. No focal weakness PSYCHIATRIC: The patient is alert and oriented x 3.  SKIN: No obvious rash, lesion, or ulcer. Per RN  LABORATORY PANEL:   CBC Recent Labs  Lab 09/10/19 1554  WBC 14.6*  HGB 13.7  HCT 40.9  PLT 307   ------------------------------------------------------------------------------------------------------------------  Chemistries  Recent Labs  Lab 09/10/19 1554  NA 137  K 4.3  CL 95*  CO2 30  GLUCOSE 223*  BUN 16  CREATININE 0.81  CALCIUM 9.0  AST 19  ALT 40  ALKPHOS 327*  BILITOT 1.1   ------------------------------------------------------------------------------------------------------------------  Cardiac Enzymes No results for input(s): TROPONINI in the last 168  hours. ------------------------------------------------------------------------------------------------------------------  RADIOLOGY:  No results found.  EKG:    IMPRESSION AND PLAN:   Alan Newman  is a 68 y.o. male with a known history of stroke in the past who is bedbound does not walk, diabetes, hypertension followed by hospice at home comes to the emergency room with poor PO intake last couple days along with nausea and vomiting.  1. Nausea vomiting and UTI -mid under observation -IV fluids -IV PRN Zofran -follow blood culture, urine culture -IV Cipro 400 BID -monitor fever curve -mild leukocytosis  2. Uncontrolled blood pressure -PRN IV labetalol -continue home meds  3. Type II diabetes, with hyperglycemia -sliding scale insulin -will resume home does insulin once PO intake improves  4. History of stroke patient is bedbound -chronic condom catheter -follows with hospice  Case management for discharge planning  Family Communication: with wife on the phone consults: Code Status: full according to the wife whose healthcare power of attorney DVT prophylaxis: heparin  TOTAL TIME TAKING CARE OF THIS PATIENT: *45* minutes.    71 M.D on 09/10/2019 at 5:53 PM  Between 7am to 6pm - Pager - [phone]  After 6pm go to [email or web address] - password [email or web address] [facility]    CC: Primary care physician; Patient, No Pcp Per

## 2019-09-10 NOTE — ED Notes (Signed)
Patient given sprite to drink. Call bell in place. Will notify RN if nausea returns with PO intake.

## 2019-09-10 NOTE — ED Notes (Signed)
Report provided to Casey, RN.

## 2019-09-11 DIAGNOSIS — E860 Dehydration: Secondary | ICD-10-CM

## 2019-09-11 DIAGNOSIS — R112 Nausea with vomiting, unspecified: Secondary | ICD-10-CM | POA: Diagnosis not present

## 2019-09-11 DIAGNOSIS — I10 Essential (primary) hypertension: Secondary | ICD-10-CM | POA: Diagnosis not present

## 2019-09-11 LAB — GLUCOSE, CAPILLARY
Glucose-Capillary: 177 mg/dL — ABNORMAL HIGH (ref 70–99)
Glucose-Capillary: 262 mg/dL — ABNORMAL HIGH (ref 70–99)
Glucose-Capillary: 195 mg/dL — ABNORMAL HIGH (ref 70–99)

## 2019-09-11 LAB — URINE CULTURE
Culture: NO GROWTH
Special Requests: NORMAL

## 2019-09-11 MED ORDER — CIPROFLOXACIN HCL 500 MG PO TABS: 500.0000 mg | ORAL_TABLET | Freq: Two times a day (BID) | ORAL | Status: DC

## 2019-09-11 MED ORDER — CIPROFLOXACIN IN D5W 400 MG/200ML IV SOLN
400.0000 mg | Freq: Two times a day (BID) | INTRAVENOUS | Status: DC
  Filled 2019-09-11: qty 200

## 2019-09-11 NOTE — TOC Initial Note (Signed)
Transition of Care [facility]) - Initial/Assessment Note    Patient Details  Name: Alan Newman MRN: [id number] Date of Birth: 05-01-1951  Transition of Care [facility] CM/SW Contact:    Shelbie Hutching, RN Phone Number: 09/11/2019, 2:55 PM  Clinical Narrative:                 Patient admitted with UTI, he is ready for discharge home today.  Patient lives with his wife and is bed bound at baseline.  Patient is currently under hospice care with [facility].  Patient will transport home via [facility].  Patient's wife Alan Newman updated on plan of care.    Expected Discharge Plan: Home w Hospice Care Barriers to Discharge: No Barriers Identified   Patient Goals and CMS Choice        Expected Discharge Plan and Services Expected Discharge Plan: Home w Hospice Care   Discharge Planning Services: CM Consult Post Acute Care Choice: Resumption of Svcs/PTA Provider Living arrangements for the past 2 months: Single Family Home Expected Discharge Date: 09/11/19                                    Prior Living Arrangements/Services Living arrangements for the past 2 months: Single Family Home Lives with:: Spouse Patient language and need for interpreter reviewed:: Yes Do you feel safe going back to the place where you live?: Yes      Need for Family Participation in Patient Care: Yes (Comment)(hospice patient) Care giver support system in place?: Yes (comment)(wife)   Criminal Activity/Legal Involvement Pertinent to Current Situation/Hospitalization: No - Comment as needed  Activities of Daily Living Home Assistive Devices/Equipment: Wheelchair ADL Screening (condition at time of admission) Patient's cognitive ability adequate to safely complete daily activities?: Yes Is the patient deaf or have difficulty hearing?: No Does the patient have difficulty seeing, even when wearing glasses/contacts?: No Does the patient have difficulty concentrating, remembering, or making  decisions?: No Patient able to express need for assistance with ADLs?: Yes Does the patient have difficulty dressing or bathing?: Yes Independently performs ADLs?: No Communication: Independent Dressing (OT): Needs assistance Is this a change from baseline?: Pre-admission baseline Grooming: Needs assistance Is this a change from baseline?: Pre-admission baseline Feeding: Independent Bathing: Needs assistance Is this a change from baseline?: Pre-admission baseline Toileting: Needs assistance Is this a change from baseline?: Pre-admission baseline In/Out Bed: Needs assistance Is this a change from baseline?: Pre-admission baseline Walks in Home: Needs assistance Is this a change from baseline?: Pre-admission baseline Does the patient have difficulty walking or climbing stairs?: Yes Weakness of Legs: Both Weakness of Arms/Hands: Left  Permission Sought/Granted Permission sought to share information with : Case Manager, Family Supports Permission granted to share information with : Yes, Verbal Permission Granted     Permission granted to share info w AGENCY: [facility] granted to share info w Relationship: Wife     Emotional Assessment Appearance:: Appears older than stated age Attitude/Demeanor/Rapport: Engaged Affect (typically observed): Flat, Quiet Orientation: : Oriented to Self, Oriented to Place, Oriented to  Time, Oriented to Situation Alcohol / Substance Use: Not Applicable Psych Involvement: No (comment)  Admission diagnosis:  UTI (urinary tract infection) [N39.0] Acute cystitis with hematuria [N30.01] Non-intractable vomiting with nausea, unspecified vomiting type [R11.2] Patient Active Problem List   Diagnosis Date Noted  . Dehydration   . UTI (urinary tract infection) 09/10/2019  . Non-intractable  vomiting   . HTN (hypertension), malignant   . Leukocytosis   . Goals of care, counseling/discussion   . Palliative care by specialist   . DNR  (do not resuscitate) discussion   . Chest pain 06/29/2019  . HTN (hypertension), benign 06/29/2019  . Diabetes (HCC) 06/29/2019  . Hyperglycemia 06/29/2019  . Protein-calorie malnutrition, severe 08/30/2018  . Acute renal failure (ARF) (HCC) 08/27/2018   PCP:  Patient, No Pcp Per Pharmacy:   [id number] - [place], [place] - [address] [place] [address] Phone: [phone] Fax: [phone]     Social Determinants of Health (SDOH) Interventions    Readmission Risk Interventions No flowsheet data found.

## 2019-09-11 NOTE — Discharge Summary (Signed)
[facility] [place] [facility]   [place] NAME: Alan Newman    MR#:  [id number]  DATE OF BIRTH:  [birth year]  DATE OF ADMISSION:  09/10/2019 ADMITTING PHYSICIAN: Enedina Finner, MD  DATE OF DISCHARGE: 09/11/2019  PRIMARY CARE PHYSICIAN: Newman, No Pcp Per    ADMISSION DIAGNOSIS:  UTI (urinary tract infection) [N39.0] Acute cystitis with hematuria [N30.01] Non-intractable vomiting with nausea, Alan vomiting type [R11.2]  DISCHARGE DIAGNOSIS:  nausea vomiting suspected due to viral gastritis resolved clinical dehydration improved   SECONDARY DIAGNOSIS:   Past Medical History:  Diagnosis Date  . Diabetes mellitus without complication (HCC)   . Hypertension   . Stroke [facility])     HOSPITAL COURSE:  [name]  is a 68 y.o. male with a known history of stroke in the past who is bedbound does not walk, diabetes, hypertension followed by hospice at home comes to the emergency room with poor PO intake last couple days along with nausea and vomiting.  1. Nausea/ vomiting suspected due to viral gastritis -received IV fluids -IV PRN Zofran -follow blood culture, urine culture--negative -IV Cipro 400 BID--d/c since UC negative -no fever -mild leukocytosis -no nausea vomiting diarrhea since admission. Tolerating regular diet  2. Uncontrolled blood pressure -PRN IV labetalol -continue home meds -improved  3. Type II diabetes, with hyperglycemia -sliding scale insulin -resume home insulin and PO meds  4. History of stroke  -Newman is bedbound -chronic condom catheter-- at home -follows with hospice  Newman is back at baseline. Will discharge Newman to home. Spoke with wife on the phone. Newman is in agreement with plan.  Resume home hospice services at discharge  Family Communication: with wife on the phone  consults: none Code Status: full according to the wife whose healthcare power of attorney DVT prophylaxis:  heparin   CONSULTS OBTAINED:    DRUG ALLERGIES:  No Known Allergies  DISCHARGE MEDICATIONS:   Allergies as of 09/11/2019   No Known Allergies     Medication List    TAKE these medications   aspirin 81 MG chewable tablet Chew 1 tablet (81 mg total) by mouth daily.   hyoscyamine 0.125 MG tablet Commonly known as: LEVSIN Take 0.125 mg by mouth every 6 (six) hours as needed for cramping.   insulin glargine 100 unit/mL Sopn Commonly known as: LANTUS Inject 0.15 mLs (15 Units total) into the skin daily.   lisinopril 10 MG tablet Commonly known as: ZESTRIL Take 10 mg by mouth 2 (two) times daily.   LORazepam 0.5 MG tablet Commonly known as: ATIVAN Take 0.5 mg by mouth every 4 (four) hours as needed for anxiety.   metFORMIN 1000 MG tablet Commonly known as: GLUCOPHAGE Take 1,000 mg by mouth daily.   morphine 30 MG 12 hr tablet Commonly known as: MS CONTIN Take 30 mg by mouth 2 (two) times daily.   ondansetron 4 MG disintegrating tablet Commonly known as: ZOFRAN-ODT Take 4 mg by mouth every 6 (six) hours as needed for nausea or vomiting.   ondansetron 4 MG tablet Commonly known as: ZOFRAN Take 4 mg by mouth every 6 (six) hours as needed for nausea or vomiting.   tamsulosin 0.4 MG Caps capsule Commonly known as: FLOMAX Take 0.4 mg by mouth at bedtime.   traZODone 50 MG tablet Commonly known as: DESYREL Take 50 mg by mouth at bedtime.       If you experience worsening of your admission symptoms, develop shortness of breath, life threatening emergency, suicidal  or homicidal thoughts you must seek medical attention immediately by calling 911 or calling your MD immediately  if symptoms less severe.  You Must read complete instructions/literature along with all the possible adverse reactions/side effects for all the Medicines you take and that have been prescribed to you. Take any new Medicines after you have completely understood and accept all the possible  adverse reactions/side effects.   Please note  You were cared for by a hospitalist during your hospital stay. If you have any questions about your discharge medications or the care you received while you were in the hospital after you are discharged, you can call the unit and asked to speak with the hospitalist on call if the hospitalist that took care of you is not available. Once you are discharged, your primary care physician will handle any further medical issues. Please note that NO REFILLS for any discharge medications will be authorized once you are discharged, as it is imperative that you return to your primary care physician (or establish a relationship with a primary care physician if you do not have one) for your aftercare needs so that they can reassess your need for medications and monitor your lab values. Today   SUBJECTIVE   Doing well. No complaints. No nausea vomiting diarrhea  VITAL SIGNS:  Blood pressure 105/73, pulse 98, temperature 98.1 F (36.7 C), resp. rate 17, height 5\' 9"  (1.753 m), weight 77.1 kg, SpO2 98 %.  I/O:    Intake/Output Summary (Last 24 hours) at 09/11/2019 1403 Last data filed at 09/11/2019 0424 Gross per 24 hour  Intake 1200.88 ml  Output 250 ml  Net 950.88 ml    PHYSICAL EXAMINATION:  GENERAL:  68 y.o.-year-old Newman lying in the bed with no acute distress.  EYES: Pupils equal, round, reactive to light and accommodation. No scleral icterus. Extraocular muscles intact.  HEENT: Head atraumatic, normocephalic. Oropharynx and nasopharynx clear.  NECK:  Supple, no jugular venous distention. No thyroid enlargement, no tenderness.  LUNGS: Normal breath sounds bilaterally, no wheezing, rales,rhonchi or crepitation. No use of accessory muscles of respiration.  CARDIOVASCULAR: S1, S2 normal. No murmurs, rubs, or gallops.  ABDOMEN: Soft, non-tender, non-distended. Bowel sounds present. No organomegaly or mass. Condom catheter EXTREMITIES: No pedal  edema, cyanosis, or clubbing.  NEUROLOGIC: no focal weakness PSYCHIATRIC: The Newman is alert and oriented x 3.  SKIN: No obvious rash, lesion, or ulcer.   DATA REVIEW:   CBC  Recent Labs  Lab 09/10/19 1554  WBC 14.6*  HGB 13.7  HCT 40.9  PLT 307    Chemistries  Recent Labs  Lab 09/10/19 1554  NA 137  K 4.3  CL 95*  CO2 30  GLUCOSE 223*  BUN 16  CREATININE 0.81  CALCIUM 9.0  AST 19  ALT 40  ALKPHOS 327*  BILITOT 1.1    Microbiology Results   Recent Results (from the past 240 hour(s))  Urine Culture     Status: None   Collection Time: 09/10/19  1:44 PM   Specimen: Urine, Clean Catch  Result Value Ref Range Status   Specimen Description   Final    URINE, CLEAN CATCH Performed at [facility], [address] [place], [place] [address]    Special Requests   Final    Normal Performed at [facility], [address], [place],  [address]    Culture   Final    NO GROWTH Performed at [facility], [place] [address],  [place], [place] [address]    Report Status 09/11/2019 FINAL  Final  Respiratory Panel by RT PCR (Flu A&B, Covid) - Nasopharyngeal Swab     Status: None   Collection Time: 09/10/19  3:55 PM   Specimen: Nasopharyngeal Swab  Result Value Ref Range Status   SARS Coronavirus 2 by RT PCR NEGATIVE NEGATIVE Final    Comment: (NOTE) SARS-CoV-2 target nucleic acids are NOT DETECTED. The SARS-CoV-2 RNA is generally detectable in upper respiratoy specimens during the acute phase of infection. The lowest concentration of SARS-CoV-2 viral copies this assay can detect is 131 copies/mL. A negative result does not preclude SARS-Cov-2 infection and should not be used as the sole basis for treatment or other Newman management decisions. A negative result may occur with  improper specimen collection/handling, submission of specimen other than nasopharyngeal swab, presence of viral mutation(s) within the areas targeted by this  assay, and inadequate number of viral copies (<131 copies/mL). A negative result must be combined with clinical observations, Newman history, and epidemiological information. The expected result is Negative. Fact Sheet for Patients:  https://www.moore.com/ Fact Sheet for Healthcare Providers:  https://www.young.biz/ This test is not yet ap proved or cleared by the [place] FDA and  has been authorized for detection and/or diagnosis of SARS-CoV-2 by FDA under an Emergency Use Authorization (EUA). This EUA will remain  in effect (meaning this test can be used) for the duration of the COVID-19 declaration under Section 564(b)(1) of the Act, 21 U.S.C. section 360bbb-3(b)(1), unless the authorization is terminated or revoked sooner.    Influenza A by PCR NEGATIVE NEGATIVE Final   Influenza B by PCR NEGATIVE NEGATIVE Final    Comment: (NOTE) The Xpert Xpress SARS-CoV-2/FLU/RSV assay is intended as an aid in  the diagnosis of influenza from Nasopharyngeal swab specimens and  should not be used as a sole basis for treatment. Nasal washings and  aspirates are unacceptable for Xpert Xpress SARS-CoV-2/FLU/RSV  testing. Fact Sheet for Patients: https://www.moore.com/ Fact Sheet for Healthcare Providers: https://www.young.biz/ This test is not yet approved or cleared by the [place] FDA and  has been authorized for detection and/or diagnosis of SARS-CoV-2 by  FDA under an Emergency Use Authorization (EUA). This EUA will remain  in effect (meaning this test can be used) for the duration of the  Covid-19 declaration under Section 564(b)(1) of the Act, 21  U.S.C. section 360bbb-3(b)(1), unless the authorization is  terminated or revoked. Performed at [facility], [address], [place], [place] [address]     RADIOLOGY:  No results found.        Start     Ordered      09/10/19 2007         Code Status History    Date Active Date Inactive Code Status Order ID Comments User Context   06/29/2019 0117 07/02/2019 2004 Full Code [id number]  Oralia Manis, MD ED   08/27/2018 1533 08/31/2018 1544 Full Code [id number]  Houston Siren, MD ED   Advance Care Planning Activity    Advance Directive Documentation     Most Recent Value  Type of Advance Directive  Healthcare Power of Attorney  Pre-existing out of facility DNR order (yellow form or pink MOST form)  --  "MOST" Form in Place?  --       TOTAL TIME TAKING CARE OF THIS Newman: *40* minutes.    Enedina Finner M.D on 09/11/2019 at 2:03 PM  Between 7am to 6pm - Pager - [phone]  After 6pm go to [email or web address] - password [email or web address]  [facility]    CC: Primary care physician; Newman, No Pcp Per

## 2020-02-01 ENCOUNTER — Emergency Department: Payer: No Typology Code available for payment source

## 2020-02-01 ENCOUNTER — Other Ambulatory Visit: Payer: Self-pay

## 2020-02-01 ENCOUNTER — Inpatient Hospital Stay
Admission: EM | Admit: 2020-02-01 | Discharge: 2020-02-07 | DRG: 690 | Disposition: A | Discharge status: Skilled Nursing Facility | Payer: No Typology Code available for payment source | Source: Skilled Nursing Facility | Attending: Internal Medicine | Admitting: Internal Medicine

## 2020-02-01 DIAGNOSIS — Z20822 Contact with and (suspected) exposure to covid-19: Secondary | ICD-10-CM | POA: Diagnosis present

## 2020-02-01 DIAGNOSIS — N3000 Acute cystitis without hematuria: Secondary | ICD-10-CM | POA: Diagnosis not present

## 2020-02-01 DIAGNOSIS — Z800 Family history of malignant neoplasm of digestive organs: Secondary | ICD-10-CM

## 2020-02-01 DIAGNOSIS — Z79899 Other long term (current) drug therapy: Secondary | ICD-10-CM | POA: Diagnosis not present

## 2020-02-01 DIAGNOSIS — Z1612 Extended spectrum beta lactamase (ESBL) resistance: Secondary | ICD-10-CM | POA: Diagnosis present

## 2020-02-01 DIAGNOSIS — Z79891 Long term (current) use of opiate analgesic: Secondary | ICD-10-CM

## 2020-02-01 DIAGNOSIS — N179 Acute kidney failure, unspecified: Secondary | ICD-10-CM

## 2020-02-01 DIAGNOSIS — R6520 Severe sepsis without septic shock: Secondary | ICD-10-CM | POA: Diagnosis not present

## 2020-02-01 DIAGNOSIS — E872 Acidosis: Secondary | ICD-10-CM | POA: Diagnosis present

## 2020-02-01 DIAGNOSIS — I69354 Hemiplegia and hemiparesis following cerebral infarction affecting left non-dominant side: Secondary | ICD-10-CM | POA: Diagnosis not present

## 2020-02-01 DIAGNOSIS — Z818 Family history of other mental and behavioral disorders: Secondary | ICD-10-CM

## 2020-02-01 DIAGNOSIS — Z841 Family history of disorders of kidney and ureter: Secondary | ICD-10-CM

## 2020-02-01 DIAGNOSIS — A498 Other bacterial infections of unspecified site: Secondary | ICD-10-CM | POA: Diagnosis not present

## 2020-02-01 DIAGNOSIS — E860 Dehydration: Secondary | ICD-10-CM | POA: Diagnosis present

## 2020-02-01 DIAGNOSIS — E1165 Type 2 diabetes mellitus with hyperglycemia: Secondary | ICD-10-CM | POA: Diagnosis present

## 2020-02-01 DIAGNOSIS — N390 Urinary tract infection, site not specified: Secondary | ICD-10-CM | POA: Diagnosis present

## 2020-02-01 DIAGNOSIS — I2510 Atherosclerotic heart disease of native coronary artery without angina pectoris: Secondary | ICD-10-CM | POA: Diagnosis present

## 2020-02-01 DIAGNOSIS — G919 Hydrocephalus, unspecified: Secondary | ICD-10-CM | POA: Diagnosis present

## 2020-02-01 DIAGNOSIS — N200 Calculus of kidney: Secondary | ICD-10-CM | POA: Diagnosis present

## 2020-02-01 DIAGNOSIS — R0902 Hypoxemia: Secondary | ICD-10-CM | POA: Diagnosis present

## 2020-02-01 DIAGNOSIS — Z7401 Bed confinement status: Secondary | ICD-10-CM | POA: Diagnosis not present

## 2020-02-01 DIAGNOSIS — A419 Sepsis, unspecified organism: Secondary | ICD-10-CM | POA: Diagnosis present

## 2020-02-01 DIAGNOSIS — Z951 Presence of aortocoronary bypass graft: Secondary | ICD-10-CM | POA: Diagnosis not present

## 2020-02-01 DIAGNOSIS — Z794 Long term (current) use of insulin: Secondary | ICD-10-CM

## 2020-02-01 DIAGNOSIS — E861 Hypovolemia: Secondary | ICD-10-CM | POA: Diagnosis not present

## 2020-02-01 DIAGNOSIS — R1110 Vomiting, unspecified: Secondary | ICD-10-CM | POA: Diagnosis present

## 2020-02-01 DIAGNOSIS — Z7982 Long term (current) use of aspirin: Secondary | ICD-10-CM | POA: Diagnosis not present

## 2020-02-01 DIAGNOSIS — B9620 Unspecified Escherichia coli [E. coli] as the cause of diseases classified elsewhere: Secondary | ICD-10-CM | POA: Diagnosis present

## 2020-02-01 DIAGNOSIS — I959 Hypotension, unspecified: Secondary | ICD-10-CM | POA: Diagnosis not present

## 2020-02-01 DIAGNOSIS — R6521 Severe sepsis with septic shock: Secondary | ICD-10-CM | POA: Diagnosis not present

## 2020-02-01 DIAGNOSIS — I10 Essential (primary) hypertension: Secondary | ICD-10-CM | POA: Diagnosis present

## 2020-02-01 DIAGNOSIS — Z87891 Personal history of nicotine dependence: Secondary | ICD-10-CM

## 2020-02-01 DIAGNOSIS — Z982 Presence of cerebrospinal fluid drainage device: Secondary | ICD-10-CM

## 2020-02-01 DIAGNOSIS — E119 Type 2 diabetes mellitus without complications: Secondary | ICD-10-CM

## 2020-02-01 DIAGNOSIS — I9589 Other hypotension: Secondary | ICD-10-CM | POA: Diagnosis not present

## 2020-02-01 LAB — CBC WITH DIFFERENTIAL/PLATELET
Abs Immature Granulocytes: 0.03 10*3/uL (ref 0.00–0.07)
Basophils Absolute: 0.1 10*3/uL (ref 0.0–0.1)
Basophils Relative: 1 %
Eosinophils Absolute: 0.4 10*3/uL (ref 0.0–0.5)
Eosinophils Relative: 4 %
HCT: 43.6 % (ref 39.0–52.0)
Hemoglobin: 13.8 g/dL (ref 13.0–17.0)
Immature Granulocytes: 0 %
Lymphocytes Relative: 17 %
Lymphs Abs: 1.5 10*3/uL (ref 0.7–4.0)
MCH: 29.6 pg (ref 26.0–34.0)
MCHC: 31.7 g/dL (ref 30.0–36.0)
MCV: 93.6 fL (ref 80.0–100.0)
Monocytes Absolute: 0.7 10*3/uL (ref 0.1–1.0)
Monocytes Relative: 8 %
Neutro Abs: 5.9 10*3/uL (ref 1.7–7.7)
Neutrophils Relative %: 70 %
Platelets: 257 10*3/uL (ref 150–400)
RBC: 4.66 MIL/uL (ref 4.22–5.81)
RDW: 14.8 % (ref 11.5–15.5)
WBC: 8.7 10*3/uL (ref 4.0–10.5)
nRBC: 0 % (ref 0.0–0.2)

## 2020-02-01 LAB — URINALYSIS, ROUTINE W REFLEX MICROSCOPIC
Bilirubin Urine: NEGATIVE
Glucose, UA: 50 mg/dL — AB
Ketones, ur: 5 mg/dL — AB
Nitrite: NEGATIVE
Protein, ur: 100 mg/dL — AB
Specific Gravity, Urine: 1.017 (ref 1.005–1.030)
WBC, UA: >50 WBC/hpf — ABNORMAL HIGH (ref 0–5)
pH: 5 (ref 5.0–8.0)

## 2020-02-01 LAB — COMPREHENSIVE METABOLIC PANEL
ALT: 14 U/L (ref 0–44)
AST: 19 U/L (ref 15–41)
Albumin: 3.5 g/dL (ref 3.5–5.0)
Alkaline Phosphatase: 104 U/L (ref 38–126)
Anion gap: 12 (ref 5–15)
BUN: 33 mg/dL — ABNORMAL HIGH (ref 8–23)
CO2: 34 mmol/L — ABNORMAL HIGH (ref 22–32)
Calcium: 8.8 mg/dL — ABNORMAL LOW (ref 8.9–10.3)
Chloride: 89 mmol/L — ABNORMAL LOW (ref 98–111)
Creatinine, Ser: 3.21 mg/dL — ABNORMAL HIGH (ref 0.61–1.24)
GFR calc Af Amer: 22 mL/min — ABNORMAL LOW (ref >60)
GFR calc non Af Amer: 19 mL/min — ABNORMAL LOW (ref >60)
Glucose, Bld: 266 mg/dL — ABNORMAL HIGH (ref 70–99)
Potassium: 4.9 mmol/L (ref 3.5–5.1)
Sodium: 135 mmol/L (ref 135–145)
Total Bilirubin: 0.8 mg/dL (ref 0.3–1.2)
Total Protein: 6.1 g/dL — ABNORMAL LOW (ref 6.5–8.1)

## 2020-02-01 LAB — LACTIC ACID, PLASMA
Lactic Acid, Venous: 3.5 mmol/L (ref 0.5–1.9)
Lactic Acid, Venous: 2.6 mmol/L (ref 0.5–1.9)
Lactic Acid, Venous: 1.5 mmol/L (ref 0.5–1.9)

## 2020-02-01 LAB — PROTIME-INR
INR: 1.1 (ref 0.8–1.2)
Prothrombin Time: 13.6 seconds (ref 11.4–15.2)

## 2020-02-01 LAB — APTT: aPTT: 26 seconds (ref 24–36)

## 2020-02-01 LAB — SARS CORONAVIRUS 2 BY RT PCR (HOSPITAL ORDER, PERFORMED IN ~~LOC~~ HOSPITAL LAB): SARS Coronavirus 2: NEGATIVE

## 2020-02-01 LAB — GLUCOSE, CAPILLARY
Glucose-Capillary: 245 mg/dL — ABNORMAL HIGH (ref 70–99)
Glucose-Capillary: 161 mg/dL — ABNORMAL HIGH (ref 70–99)

## 2020-02-01 MED ORDER — SODIUM CHLORIDE 0.9 % IV BOLUS (SEPSIS)
500.0000 mL | Freq: Once | INTRAVENOUS | Status: AC
  Administered 2020-02-01: 500 mL via INTRAVENOUS

## 2020-02-01 MED ORDER — SODIUM CHLORIDE 0.9 % IV BOLUS (SEPSIS)
1000.0000 mL | Freq: Once | INTRAVENOUS | Status: AC
  Administered 2020-02-01: 1000 mL via INTRAVENOUS

## 2020-02-01 MED ORDER — SODIUM CHLORIDE 0.9 % IV BOLUS (SEPSIS)
1000.0000 mL | Freq: Once | INTRAVENOUS | Status: AC
  Administered 2020-02-01: 16:00:00 1000 mL via INTRAVENOUS

## 2020-02-01 MED ORDER — ONDANSETRON HCL 4 MG PO TABS: 4.0000 mg | ORAL_TABLET | Freq: Four times a day (QID) | ORAL | Status: DC | PRN

## 2020-02-01 MED ORDER — ACETAMINOPHEN 650 MG RE SUPP: 650.0000 mg | Freq: Four times a day (QID) | RECTAL | Status: DC | PRN

## 2020-02-01 MED ORDER — NOREPINEPHRINE 4 MG/250ML-% IV SOLN
2.0000 ug/min | INTRAVENOUS | Status: DC
  Administered 2020-02-01: 23:00:00 2 ug/min via INTRAVENOUS
  Administered 2020-02-02: 10:00:00 10 ug/min via INTRAVENOUS
  Filled 2020-02-01 (×2): qty 250

## 2020-02-01 MED ORDER — POLYETHYLENE GLYCOL 3350 17 G PO PACK: 17.0000 g | PACK | Freq: Every day | ORAL | Status: DC | PRN

## 2020-02-01 MED ORDER — INSULIN ASPART 100 UNIT/ML ~~LOC~~ SOLN
0.0000 [IU] | Freq: Every day | SUBCUTANEOUS | Status: DC
  Administered 2020-02-05: 4 [IU] via SUBCUTANEOUS
  Administered 2020-02-07: 3 [IU] via SUBCUTANEOUS
  Filled 2020-02-01 (×2): qty 1

## 2020-02-01 MED ORDER — INSULIN ASPART 100 UNIT/ML ~~LOC~~ SOLN
0.0000 [IU] | Freq: Three times a day (TID) | SUBCUTANEOUS | Status: DC
  Administered 2020-02-02 (×3): 3 [IU] via SUBCUTANEOUS
  Administered 2020-02-03: 08:00:00 1 [IU] via SUBCUTANEOUS
  Administered 2020-02-04: 2 [IU] via SUBCUTANEOUS
  Administered 2020-02-04: 14:00:00 1 [IU] via SUBCUTANEOUS
  Administered 2020-02-05: 17:00:00 5 [IU] via SUBCUTANEOUS
  Administered 2020-02-05: 09:00:00 2 [IU] via SUBCUTANEOUS
  Administered 2020-02-05: 12:00:00 3 [IU] via SUBCUTANEOUS
  Administered 2020-02-06: 13:00:00 5 [IU] via SUBCUTANEOUS
  Administered 2020-02-06: 09:00:00 3 [IU] via SUBCUTANEOUS
  Administered 2020-02-06 – 2020-02-07 (×2): 7 [IU] via SUBCUTANEOUS
  Administered 2020-02-07: 3 [IU] via SUBCUTANEOUS
  Filled 2020-02-01 (×15): qty 1

## 2020-02-01 MED ORDER — INSULIN GLARGINE 100 UNIT/ML ~~LOC~~ SOLN
15.0000 [IU] | Freq: Every day | SUBCUTANEOUS | Status: DC
  Administered 2020-02-02 – 2020-02-03 (×2): 15 [IU] via SUBCUTANEOUS
  Filled 2020-02-01 (×4): qty 0.15

## 2020-02-01 MED ORDER — SODIUM CHLORIDE 0.9 % IV SOLN
2.0000 g | Freq: Once | INTRAVENOUS | Status: AC
  Administered 2020-02-01: 18:00:00 2 g via INTRAVENOUS
  Filled 2020-02-01: qty 2

## 2020-02-01 MED ORDER — BISACODYL 5 MG PO TBEC: 5.0000 mg | DELAYED_RELEASE_TABLET | Freq: Every day | ORAL | Status: DC | PRN

## 2020-02-01 MED ORDER — HYDROCODONE-ACETAMINOPHEN 5-325 MG PO TABS: 1.0000 | ORAL_TABLET | Freq: Three times a day (TID) | ORAL | Status: DC | PRN

## 2020-02-01 MED ORDER — CIPROFLOXACIN IN D5W 400 MG/200ML IV SOLN
400.0000 mg | Freq: Once | INTRAVENOUS | Status: AC
  Administered 2020-02-01: 17:00:00 400 mg via INTRAVENOUS
  Filled 2020-02-01: qty 200

## 2020-02-01 MED ORDER — HEPARIN SODIUM (PORCINE) 5000 UNIT/ML IJ SOLN
5000.0000 [IU] | Freq: Three times a day (TID) | INTRAMUSCULAR | Status: DC
  Administered 2020-02-01 – 2020-02-07 (×18): 5000 [IU] via SUBCUTANEOUS
  Filled 2020-02-01 (×18): qty 1

## 2020-02-01 MED ORDER — SODIUM CHLORIDE 0.9 % IV SOLN: INTRAVENOUS | Status: DC

## 2020-02-01 MED ORDER — SODIUM CHLORIDE 0.9 % IV SOLN: 250.0000 mL | INTRAVENOUS | Status: DC

## 2020-02-01 MED ORDER — SODIUM CHLORIDE 0.9 % IV BOLUS
1000.0000 mL | Freq: Once | INTRAVENOUS | Status: AC
  Administered 2020-02-01: 20:00:00 1000 mL via INTRAVENOUS

## 2020-02-01 MED ORDER — SODIUM CHLORIDE 0.9 % IV SOLN
1.0000 g | INTRAVENOUS | Status: DC
  Filled 2020-02-01: qty 1

## 2020-02-01 MED ORDER — INSULIN GLARGINE 100 UNIT/ML ~~LOC~~ SOLN
15.0000 [IU] | Freq: Every day | SUBCUTANEOUS | Status: DC
  Filled 2020-02-01 (×2): qty 0.15

## 2020-02-01 MED ORDER — VANCOMYCIN HCL 1500 MG/300ML IV SOLN
1500.0000 mg | Freq: Once | INTRAVENOUS | Status: AC
  Administered 2020-02-02: 1500 mg via INTRAVENOUS
  Filled 2020-02-01 (×2): qty 300

## 2020-02-01 MED ORDER — TRAZODONE HCL 50 MG PO TABS: 50.0000 mg | ORAL_TABLET | Freq: Every evening | ORAL | Status: DC | PRN

## 2020-02-01 MED ORDER — TAMSULOSIN HCL 0.4 MG PO CAPS
0.4000 mg | ORAL_CAPSULE | Freq: Every day | ORAL | Status: DC
  Administered 2020-02-02 – 2020-02-06 (×6): 0.4 mg via ORAL
  Filled 2020-02-01 (×5): qty 1

## 2020-02-01 MED ORDER — VANCOMYCIN VARIABLE DOSE PER UNSTABLE RENAL FUNCTION (PHARMACIST DOSING): Status: DC

## 2020-02-01 MED ORDER — HYDROCODONE-ACETAMINOPHEN 5-325 MG PO TABS: 1.0000 | ORAL_TABLET | ORAL | Status: DC | PRN

## 2020-02-01 MED ORDER — ACETAMINOPHEN 325 MG PO TABS
650.0000 mg | ORAL_TABLET | Freq: Four times a day (QID) | ORAL | Status: DC | PRN
  Administered 2020-02-05: 650 mg via ORAL
  Filled 2020-02-01: qty 2

## 2020-02-01 MED ORDER — ONDANSETRON HCL 4 MG/2ML IJ SOLN
4.0000 mg | Freq: Four times a day (QID) | INTRAMUSCULAR | Status: DC | PRN
  Administered 2020-02-03 – 2020-02-05 (×3): 4 mg via INTRAVENOUS
  Filled 2020-02-01 (×3): qty 2

## 2020-02-01 MED ORDER — HYOSCYAMINE SULFATE 0.125 MG PO TBDP
0.1250 mg | ORAL_TABLET | Freq: Four times a day (QID) | ORAL | Status: DC | PRN
  Filled 2020-02-01: qty 1

## 2020-02-01 NOTE — ED Notes (Signed)
2 unsuccessful IV attempts by this RN (right AC and right wrist).

## 2020-02-01 NOTE — Progress Notes (Signed)
Pharmacy Antibiotic Note  Alan Newman is a 69 y.o. male admitted on 02/01/2020 with sepsis.  Pharmacy has been consulted for Vancomycin dosing.  Plan: Vancomycin 1500mg  x 1 dose then variable dosing based on levels and renal fxn.    Height: 6' (182.9 cm) Weight: 81.6 kg (180 lb) IBW/kg (Calculated) : 77.6  Temp (24hrs), Avg:98.2 F (36.8 C), Min:98.2 F (36.8 C), Max:98.2 F (36.8 C)  Recent Labs  Lab 02/01/20 1530 02/01/20 1744  WBC 8.7  --   CREATININE 3.21*  --   LATICACIDVEN 3.5* 2.6*    Estimated Creatinine Clearance: 24.8 mL/min (A) (by C-G formula based on SCr of 3.21 mg/dL (H)).    No Known Allergies  Antimicrobials this admission:   >>    >>   Dose adjustments this admission:   Microbiology results:  BCx:   UCx:    Sputum:    MRSA PCR:   Thank you for allowing pharmacy to be a part of this patient's care.  04/02/20 A 02/01/2020 11:44 PM

## 2020-02-01 NOTE — ED Notes (Signed)
Val RN attempted IV access no success

## 2020-02-01 NOTE — Consult Note (Signed)
Pharmacy Antibiotic Note  Alan Newman is a 70 y.o. male admitted on 02/01/2020 with UTI.  Pharmacy has been consulted for Cefepime dosing. Patient currently in AKI.  Plan: Patient received Cefepime 2g IV x 1 dose in ED. Will order Cefepime 1g IV Q24 hours to start tomorrow.  Will continue to monitor renal function and adjust dose as deemed necessary.   Height: 6' (182.9 cm) Weight: 81.6 kg (180 lb) IBW/kg (Calculated) : 77.6  Temp (24hrs), Avg:98.2 F (36.8 C), Min:98.2 F (36.8 C), Max:98.2 F (36.8 C)  Recent Labs  Lab 02/01/20 1530  WBC 8.7  CREATININE 3.21*  LATICACIDVEN 3.5*    Estimated Creatinine Clearance: 24.8 mL/min (A) (by C-G formula based on SCr of 3.21 mg/dL (H)).    No Known Allergies  Antimicrobials this admission: Cefepime 5/10 >>  Ciprofloxacin 5/10 x1   Microbiology results: 5/10 BCx: pending 5/10 UCx: pending    Thank you for allowing pharmacy to be a part of this patient's care.  Juana Montini A Nala Kachel 02/01/2020 6:18 PM
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
## 2020-02-01 NOTE — ED Provider Notes (Signed)
[facility] Emergency Department Provider Note  ____________________________________________   I have reviewed the triage vital signs and the nursing notes.   HISTORY  Chief Complaint Hypotension   History limited by: Not Limited   HPI NGHIA MCENTEE is a 69 y.o. male who presents to the emergency department today because of concerns for low blood pressure.  Patient states that he did feel dizzy headed today.  He states that he did not have that sensation yesterday.  He is coming from the living facility.  Blood pressure was noted to be low at the living facility.  Apparently the patient has recurrent UTIs and has had sepsis in the past.  Patient denies any chest pain, shortness of breath.   Records reviewed. Per medical record review patient has a history of DM, CVA. UTI.   Past Medical History:  Diagnosis Date  . Diabetes mellitus without complication (HCC)   . Hypertension   . Stroke [facility])     Patient Active Problem List   Diagnosis Date Noted  . Dehydration   . UTI (urinary tract infection) 09/10/2019  . Non-intractable vomiting   . HTN (hypertension), malignant   . Leukocytosis   . Goals of care, counseling/discussion   . Palliative care by specialist   . DNR (do not resuscitate) discussion   . Chest pain 06/29/2019  . HTN (hypertension), benign 06/29/2019  . Diabetes (HCC) 06/29/2019  . Hyperglycemia 06/29/2019  . Protein-calorie malnutrition, severe 08/30/2018  . Acute renal failure (ARF) (HCC) 08/27/2018    Past Surgical History:  Procedure Laterality Date  . BRAIN SURGERY    . CARDIAC SURGERY      Prior to Admission medications   Medication Sig Start Date End Date Taking? Authorizing Provider  aspirin 81 MG chewable tablet Chew 1 tablet (81 mg total) by mouth daily. 07/02/19   Altamese Dilling, MD  hyoscyamine (LEVSIN) 0.125 MG tablet Take 0.125 mg by mouth every 6 (six) hours as needed for cramping.    [provider]  insulin glargine (LANTUS) 100 unit/mL SOPN Inject 0.15 mLs (15 Units total) into the skin daily. 07/02/19   Altamese Dilling, MD  lisinopril (ZESTRIL) 10 MG tablet Take 10 mg by mouth 2 (two) times daily.    [provider]  LORazepam (ATIVAN) 0.5 MG tablet Take 0.5 mg by mouth every 4 (four) hours as needed for anxiety.    [provider]  metFORMIN (GLUCOPHAGE) 1000 MG tablet Take 1,000 mg by mouth daily.    [provider]  morphine (MS CONTIN) 30 MG 12 hr tablet Take 30 mg by mouth 2 (two) times daily. 08/31/19   [provider]  ondansetron (ZOFRAN) 4 MG tablet Take 4 mg by mouth every 6 (six) hours as needed for nausea or vomiting.    [provider]  ondansetron (ZOFRAN-ODT) 4 MG disintegrating tablet Take 4 mg by mouth every 6 (six) hours as needed for nausea or vomiting.    [provider]  tamsulosin (FLOMAX) 0.4 MG CAPS capsule Take 0.4 mg by mouth at bedtime.    [provider]  traZODone (DESYREL) 50 MG tablet Take 50 mg by mouth at bedtime.    [provider]    Allergies Patient has no known allergies.  Family History  Problem Relation Age of Onset  . Kidney disease Mother   . Dementia Father   . Cancer Father   . Stomach cancer Sister     Social History Social History  Tobacco Use  . Smoking status: Never Smoker  . Smokeless tobacco: Former Neurosurgeon    Types: Chew  Substance Use Topics  . Alcohol use: Never  . Drug use: Never    Review of Systems Constitutional: No fever/chills Eyes: No visual changes. ENT: No sore throat. Cardiovascular: Denies chest pain. Respiratory: Denies shortness of breath. Gastrointestinal: No abdominal pain.  No nausea, no vomiting.  No diarrhea.   Genitourinary: Negative for dysuria. Musculoskeletal: Negative for back pain. Skin: Negative for rash. Neurological: Positive for lightheadedness.   ____________________________________________   PHYSICAL EXAM:  VITAL SIGNS: ED Triage Vitals  Enc Vitals Group     BP 02/01/20 1517 (!) 58/43     Pulse Rate 02/01/20 1531 98     Resp 02/01/20 1517 20     Temp 02/01/20 1517 98.2 F (36.8 C)     Temp Source 02/01/20 1517 Oral     SpO2 02/01/20 1517 (!) 86 %     Weight 02/01/20 1521 180 lb (81.6 kg)     Height 02/01/20 1521 6' (1.829 m)     Head Circumference --      Peak Flow --      Pain Score 02/01/20 1520 0   Constitutional: Alert and oriented.  Eyes: Conjunctivae are normal.  ENT      Head: Normocephalic and atraumatic.      Nose: No congestion/rhinnorhea.      Mouth/Throat: Mucous membranes are moist.      Neck: No stridor. Hematological/Lymphatic/Immunilogical: No cervical lymphadenopathy. Cardiovascular: Normal rate, regular rhythm.  No murmurs, rubs, or gallops. Respiratory: Normal respiratory effort without tachypnea nor retractions. Breath sounds are clear and equal bilaterally. No wheezes/rales/rhonchi. Gastrointestinal: Soft and non tender. No rebound. No guarding.  Genitourinary: Condom cath in place. Upon removal small area of skin irritation to the ventral surface Musculoskeletal: Normal range of motion in all extremities. No lower extremity edema. Neurologic:  Normal speech and language. No gross focal neurologic deficits are appreciated.  Skin:  Skin is warm, dry and intact. No rash noted. Psychiatric: Mood and affect are normal. Speech and behavior are normal. Patient exhibits appropriate insight and judgment.  ____________________________________________    LABS (pertinent positives/negatives)  UA turbid, moderate leukocytes, >50 wbc, rare bacteria CMP na 135, k 4.9, glu 266, cr 3.21 Lactic 3.5 CBC wbc 8.7, hgb 13.8, plt 257  ____________________________________________   EKG  I, Phineas Semen, attending physician, personally viewed and interpreted this EKG  EKG Time: 1521 Rate: 98 Rhythm:  normal sinus rhythm Axis: left axis deviation Intervals: qtc 497 QRS: narrow ST changes: no st elevation Impression: abnormal ekg  ____________________________________________    RADIOLOGY  CXR No acute abnormality  ____________________________________________   PROCEDURES  Procedures  Angiocath insertion Performed by: Phineas Semen  Consent: Verbal consent obtained. Risks and benefits: risks, benefits and alternatives were discussed Time out: Immediately prior to procedure a "time out" was called to verify the correct patient, procedure, equipment, support staff and site/side marked as required.  Preparation: Patient was prepped and draped in the usual sterile fashion.  Vein Location: left upper arm  Ultrasound Guided  Gauge: 20  Normal blood return and flush without difficulty Patient tolerance: Patient tolerated the procedure well with no immediate complications.  CRITICAL CARE Performed by: Phineas Semen   Total critical care time: 30 minutes  Critical care time was exclusive of separately billable procedures and treating other patients.  Critical care was necessary to treat or prevent imminent or life-threatening deterioration.  Critical  care was time spent personally by me on the following activities: development of treatment plan with patient and/or surrogate as well as nursing, discussions with consultants, evaluation of patient's response to treatment, examination of patient, obtaining history from patient or surrogate, ordering and performing treatments and interventions, ordering and review of laboratory studies, ordering and review of radiographic studies, pulse oximetry and re-evaluation of patient's condition.   ____________________________________________   INITIAL IMPRESSION / ASSESSMENT AND PLAN / ED COURSE  Pertinent labs & imaging results that were available during my care of the patient were reviewed by me and considered in my medical  decision making (see chart for details).   Patient presented to the emergency department today from living facility because of concerns for low blood pressure.  Patient was complaining of some lightheadedness.  On initial exam patient was found to be significantly hypotensive.  IV catheter was able to be started on her ultrasound guidance.  He was started on IV fluids.  Blood work was notable for elevated lactic.  Patient has history of recurrent UTIs and UA is again suggestive of a urinary tract infection.  Patient was started on antibiotics based on previous microbiology.  Will plan on admission.  I discussed findings and plan with patient.  ____________________________________________   FINAL CLINICAL IMPRESSION(S) / ED DIAGNOSES  Final diagnoses:  AKI (acute kidney injury) ([place])  Lower urinary tract infectious disease  Dehydration     Note: This dictation was prepared with Dragon dictation. Any transcriptional errors that result from this process are unintentional     Alan Pear, MD 02/01/20 1845

## 2020-02-01 NOTE — ED Triage Notes (Signed)
PT to ED via EMS from [facility]. Per ems pt BP 66/44. EMS states pt AO at baseline. PT only complaint is "dark vision". HX of chronic UTIs

## 2020-02-01 NOTE — Consult Note (Signed)
Name: Alan Newman MRN: [id number] DOB: 07-Jul-1951    ADMISSION DATE:  02/01/2020 CONSULTATION DATE: 02/01/2020  REFERRING MD : Sharion Settler, NP   CHIEF COMPLAINT: Hypotension   BRIEF PATIENT DESCRIPTION:  69 yo male admitted with hypotension secondary to urosepis requiring levophed gtt   SIGNIFICANT EVENTS /STUDIES:  05/10: Pt initially admitted to the medsurg unit with UTI, however while awaiting bed availability in the ER pt became hypotensive requiring levophed gtt    HISTORY OF PRESENT ILLNESS:   This is a 69 yo male with a PMH of Stroke (bed bound at baseline), Recurrent UTI's,  HTN, and Type II Diabetes Mellitus.  He presented to [facility] ER on 05/10 via EMS from [facility] with hypotension.  EMS reported sbp in the 60's.  ER lab results revealed chloride 89, CO2 34, glucose 266, BUN 33, creatinine 3.21, calcium 8.8, lactic acid 3.5, COVID-19 negative, and CXR negative for acute abnormalities.  UA positive for UTI, and pt remained hypotensive.  Therefore, he received 2.5L NS bolus, and hypotension initially resolved.  ER physician contacted hospitalisit team for medsurg admission.  While awaiting medsurg bed availability in the ER the pt developed hypotension again and received 1L NS bolus.  Initially map >65 post fluid rescuscitation, however he developed hypotension again requiring levophed gtt. PCCM team contacted to assume pt care.    PAST MEDICAL HISTORY :   has a past medical history of Diabetes mellitus without complication ([place]), Hypertension, and Stroke ([place]).  has a past surgical history that includes Cardiac surgery and Brain surgery. Prior to Admission medications   Medication Sig Start Date End Date Taking? Authorizing Provider  insulin glargine (LANTUS) 100 unit/mL SOPN Inject 0.15 mLs (15 Units total) into the skin daily. 07/02/19  Yes Vaughan Basta, MD  lisinopril (ZESTRIL) 10 MG tablet Take 10 mg by mouth 2 (two) times daily.   Yes [provider]  metFORMIN (GLUCOPHAGE) 1000 MG tablet Take 1,000 mg by mouth daily.   Yes [provider]  mirtazapine (REMERON) 30 MG tablet Take by mouth. 10/06/18  Yes [provider]  ondansetron (ZOFRAN) 4 MG tablet Take 4 mg by mouth every 6 (six) hours as needed for nausea or vomiting.   Yes [provider]  tamsulosin (FLOMAX) 0.4 MG CAPS capsule Take 0.4 mg by mouth at bedtime.   Yes [provider]  traZODone (DESYREL) 50 MG tablet Take 50 mg by mouth at bedtime.   Yes [provider]  hyoscyamine (LEVSIN) 0.125 MG tablet Take 0.125 mg by mouth every 6 (six) hours as needed for cramping.    [provider]   No Known Allergies  FAMILY HISTORY:  family history includes Cancer in his father; Dementia in his father; Kidney disease in his mother; Stomach cancer in his sister. SOCIAL HISTORY:  reports that he has never smoked. He has quit using smokeless tobacco.  His smokeless tobacco use included chew. He reports that he does not drink alcohol or use drugs.  REVIEW OF SYSTEMS: Positives in BOLD  Constitutional: Negative for fever, chills, weight loss, malaise/fatigue and diaphoresis.  HENT: Negative for hearing loss, ear pain, nosebleeds, congestion, sore throat, neck pain, tinnitus and ear discharge.   Eyes: Negative for blurred vision, double vision, photophobia, pain, discharge and redness.  Respiratory: Negative for cough, hemoptysis, sputum production, shortness of breath, wheezing and stridor.   Cardiovascular: Negative for chest pain, palpitations, orthopnea, claudication, leg swelling and PND.  Gastrointestinal: Negative for heartburn, nausea,  vomiting, abdominal pain, diarrhea, constipation, blood in stool and melena.  Genitourinary: Negative for dysuria, urgency, frequency, hematuria and flank pain.  Musculoskeletal: Negative for myalgias, back pain, joint pain and falls.  Skin: Negative for itching and rash.  Neurological: slight  changes in vision, dizziness, tingling, tremors, sensory change, speech change, focal weakness, seizures, loss of consciousness, weakness and headaches.  Endo/Heme/Allergies: Negative for environmental allergies and polydipsia. Does not bruise/bleed easily.  SUBJECTIVE:  No complaints at this time   VITAL SIGNS: Temp:  [98.2 F (36.8 C)] 98.2 F (36.8 C) (05/10 1517) Pulse Rate:  [86-107] 106 (05/10 2218) Resp:  [10-20] 20 (05/10 2218) BP: (58-89)/(43-67) 81/57 (05/10 2218) SpO2:  [86 %-100 %] 95 % (05/10 2218) Weight:  [81.6 kg] 81.6 kg (05/10 1521)  PHYSICAL EXAMINATION: General: well developed, well nourished male, NAD resting in bed  Neuro: alert and oriented, LUE hemiparesis, bed bound at baseline HEENT: supple, no JVD  Cardiovascular: sinus tach, no R/G  Lungs: clear throughout, even, non labored  Abdomen: +BS x4, soft, non tender, non distended  Musculoskeletal: able to move bilateral lower extremities, bed bound at baseline secondary to previous stroke  Skin: intact no rashes or lesions present   Recent Labs  Lab 02/01/20 1530  NA 135  K 4.9  CL 89*  CO2 34*  BUN 33*  CREATININE 3.21*  GLUCOSE 266*   Recent Labs  Lab 02/01/20 1530  HGB 13.8  HCT 43.6  WBC 8.7  PLT 257   DG Chest Port 1 View  Result Date: 02/01/2020 CLINICAL DATA:  Hypoxia. EXAM: PORTABLE CHEST 1 VIEW COMPARISON:  June 28, 2019 FINDINGS: Multiple sternal wires are seen. Radiopaque fixation plates and screws are seen overlying the anterior and lateral aspects of multiple left ribs. Additional chronic rib deformities are seen on the left. There is no evidence of acute infiltrate, pleural effusion or pneumothorax. The heart size and mediastinal contours are within normal limits. Multilevel degenerative changes seen throughout the thoracic spine. IMPRESSION: 1. Evidence of prior median sternotomy/CABG. 2. Multiple chronic left-sided rib deformities. Electronically Signed   By: Aram Candela  M.D.   On: 02/01/2020 16:02    ASSESSMENT / PLAN:  Hypotension secondary to urosepsis  Hx: Stroke and HTN  Continuous telemetry monitoring  Aggressive fluid resuscitation and prn levophed gtt to maintain map >65 Continue NS @100  ml/hr  Hold outpatient antihypertensives   Acute renal failure and lactic acidosis secondary to urosepsis  Trend BMP  Repeat lactic acid  Replace electrolytes as indicated  Monitor UOP Avoid nephrotoxic medications   UTI (pt has a hx of recurrent UTI's) Trend WBC and monitor fever curve  Trend PCT  Follow cultures  Continue cefepime, will add vancomycin for now until urine cultures resulted  Type II Diabetes Mellitus  CBG's ac/hs SSI and scheduled lantus   Chronically bed bound secondary to previous stroke Turn q2hrs   , AGNP  Pulmonary/Critical Care Pager [phone] (please enter 7 digits) PCCM Consult Pager [phone] (please enter 7 digits)

## 2020-02-01 NOTE — ED Notes (Signed)
Derrill Kay MD attempting [place] IV access

## 2020-02-01 NOTE — ED Notes (Signed)
PT placed in trendelenburg  

## 2020-02-01 NOTE — ED Notes (Signed)
Per NP, BP okay as long  as maintaining MAP of 65

## 2020-02-01 NOTE — ED Notes (Signed)
Informed Marisa Cyphers, NP of pt's worsening blood pressure. States she will review chart.

## 2020-02-01 NOTE — ED Notes (Signed)
1 set of cultures obtained. OK per MD goodman since pt hard stick.

## 2020-02-02 DIAGNOSIS — A419 Sepsis, unspecified organism: Secondary | ICD-10-CM | POA: Diagnosis present

## 2020-02-02 LAB — BASIC METABOLIC PANEL
Anion gap: 8 (ref 5–15)
BUN: 27 mg/dL — ABNORMAL HIGH (ref 8–23)
CO2: 31 mmol/L (ref 22–32)
Calcium: 8.2 mg/dL — ABNORMAL LOW (ref 8.9–10.3)
Chloride: 99 mmol/L (ref 98–111)
Creatinine, Ser: 1.84 mg/dL — ABNORMAL HIGH (ref 0.61–1.24)
GFR calc Af Amer: 43 mL/min — ABNORMAL LOW (ref >60)
GFR calc non Af Amer: 37 mL/min — ABNORMAL LOW (ref >60)
Glucose, Bld: 218 mg/dL — ABNORMAL HIGH (ref 70–99)
Potassium: 4.2 mmol/L (ref 3.5–5.1)
Sodium: 138 mmol/L (ref 135–145)

## 2020-02-02 LAB — PROTIME-INR
INR: 1.1 (ref 0.8–1.2)
Prothrombin Time: 13.5 seconds (ref 11.4–15.2)

## 2020-02-02 LAB — CBC
HCT: 36.9 % — ABNORMAL LOW (ref 39.0–52.0)
Hemoglobin: 12 g/dL — ABNORMAL LOW (ref 13.0–17.0)
MCH: 30.1 pg (ref 26.0–34.0)
MCHC: 32.5 g/dL (ref 30.0–36.0)
MCV: 92.5 fL (ref 80.0–100.0)
Platelets: 270 10*3/uL (ref 150–400)
RBC: 3.99 MIL/uL — ABNORMAL LOW (ref 4.22–5.81)
RDW: 14.9 % (ref 11.5–15.5)
WBC: 9.4 10*3/uL (ref 4.0–10.5)
nRBC: 0 % (ref 0.0–0.2)

## 2020-02-02 LAB — PROCALCITONIN
Procalcitonin: <0.1 ng/mL
Procalcitonin: 0.12 ng/mL

## 2020-02-02 LAB — HEMOGLOBIN A1C
Hgb A1c MFr Bld: 10.3 % — ABNORMAL HIGH (ref 4.8–5.6)
Mean Plasma Glucose: 248.91 mg/dL

## 2020-02-02 LAB — GLUCOSE, CAPILLARY
Glucose-Capillary: 163 mg/dL — ABNORMAL HIGH (ref 70–99)
Glucose-Capillary: 245 mg/dL — ABNORMAL HIGH (ref 70–99)
Glucose-Capillary: 251 mg/dL — ABNORMAL HIGH (ref 70–99)
Glucose-Capillary: 232 mg/dL — ABNORMAL HIGH (ref 70–99)
Glucose-Capillary: 140 mg/dL — ABNORMAL HIGH (ref 70–99)

## 2020-02-02 LAB — MRSA PCR SCREENING: MRSA by PCR: POSITIVE — AB

## 2020-02-02 LAB — CORTISOL-AM, BLOOD: Cortisol - AM: 7.5 ug/dL (ref 6.7–22.6)

## 2020-02-02 MED ORDER — MIDODRINE HCL 5 MG PO TABS
10.0000 mg | ORAL_TABLET | Freq: Three times a day (TID) | ORAL | Status: DC
  Administered 2020-02-02 – 2020-02-03 (×5): 10 mg via ORAL
  Filled 2020-02-02 (×5): qty 2

## 2020-02-02 MED ORDER — SODIUM CHLORIDE 0.9 % IV SOLN
2.0000 g | Freq: Two times a day (BID) | INTRAVENOUS | Status: DC
  Administered 2020-02-02 – 2020-02-03 (×3): 2 g via INTRAVENOUS
  Filled 2020-02-02 (×4): qty 2

## 2020-02-02 MED ORDER — CHLORHEXIDINE GLUCONATE CLOTH 2 % EX PADS
6.0000 | MEDICATED_PAD | Freq: Every day | CUTANEOUS | Status: DC
  Administered 2020-02-02: 6 via TOPICAL
  Filled 2020-02-02: qty 6

## 2020-02-02 MED ORDER — VANCOMYCIN HCL IN DEXTROSE 1-5 GM/200ML-% IV SOLN: 1000.0000 mg | INTRAVENOUS | Status: DC

## 2020-02-02 MED ORDER — LACTATED RINGERS IV BOLUS
1000.0000 mL | Freq: Once | INTRAVENOUS | Status: AC
  Administered 2020-02-02: 1000 mL via INTRAVENOUS

## 2020-02-02 MED ORDER — SODIUM CHLORIDE 0.9 % IV BOLUS
500.0000 mL | Freq: Once | INTRAVENOUS | Status: AC
  Administered 2020-02-02: 500 mL via INTRAVENOUS

## 2020-02-02 NOTE — Progress Notes (Addendum)
CSW attempted call to patient's spouse to confirm he is from [facility] and active with [facility] for Hospice (per notes), as well as discuss any needs. Left a voicemail requesting a return call so this can be done.  Alfonso Ramus, [place] [phone]

## 2020-02-02 NOTE — Progress Notes (Signed)
Inpatient Diabetes Program Recommendations  AACE/ADA: New Consensus Statement on Inpatient Glycemic Control (2015)  Target Ranges:  Prepandial:   less than 140 mg/dL      Peak postprandial:   less than 180 mg/dL (1-2 hours)      Critically ill patients:  140 - 180 mg/dL   Results for ABUNDIO, TEUSCHER (MRN [id number]) as of 02/02/2020 08:17  Ref. Range 02/01/2020 15:29 02/01/2020 22:40 02/02/2020 01:16 02/02/2020 07:25  Glucose-Capillary Latest Ref Range: 70 - 99 mg/dL 035 (H) 597 (H) 416 (H) 245 (H)   Results for NYCHOLAS, RAYNER (MRN [id number]) as of 02/02/2020 08:17  Ref. Range 06/30/2019 08:19 09/10/2019 19:08 02/01/2020 18:08  Hemoglobin A1C Latest Ref Range: 4.8 - 5.6 % 12.9 (H) 9.5 (H) 10.3 (H)  (248 mg/dl)    Admit with: Sepsis due to UTI/ Hypotension/ Acute Kidney Injury  History: DM, CVA, Followed at [facility] (respite stay)/ Home by Hospice  Home DM Meds: Lantus 15 units Daily       Metformin 1000 mg Daily  Current Orders: Lantus 15 units Daily      Novolog Sensitive Correction Scale/ SSI (0-9 units) TID AC + HS     MD- Note Lantus and Novolog to start this AM.  Patient's last few A1c levels in the Chart have been elevated.  Unsure how aggressive his PCP desires his CBG control to be given he is getting Hospice care at home??    --Will follow patient during hospitalization--  Ambrose Finland RN, MSN, CDE Diabetes Coordinator Inpatient Glycemic Control Team Team Pager: [phone] (8a-5p)

## 2020-02-03 DIAGNOSIS — A419 Sepsis, unspecified organism: Secondary | ICD-10-CM

## 2020-02-03 DIAGNOSIS — R6520 Severe sepsis without septic shock: Secondary | ICD-10-CM

## 2020-02-03 LAB — GLUCOSE, CAPILLARY
Glucose-Capillary: 139 mg/dL — ABNORMAL HIGH (ref 70–99)
Glucose-Capillary: 92 mg/dL (ref 70–99)
Glucose-Capillary: 85 mg/dL (ref 70–99)
Glucose-Capillary: 78 mg/dL (ref 70–99)

## 2020-02-03 LAB — PROCALCITONIN: Procalcitonin: <0.1 ng/mL

## 2020-02-03 LAB — URINE CULTURE: Culture: >=100000 — AB

## 2020-02-03 LAB — HIV ANTIBODY (ROUTINE TESTING W REFLEX): HIV Screen 4th Generation wRfx: NONREACTIVE

## 2020-02-03 MED ORDER — MUPIROCIN 2 % EX OINT
1.0000 "application " | TOPICAL_OINTMENT | Freq: Two times a day (BID) | CUTANEOUS | Status: DC
  Administered 2020-02-03 – 2020-02-07 (×9): 1 via NASAL
  Filled 2020-02-03: qty 22

## 2020-02-03 MED ORDER — PIPERACILLIN-TAZOBACTAM 3.375 G IVPB
3.3750 g | Freq: Three times a day (TID) | INTRAVENOUS | Status: DC
  Administered 2020-02-03 – 2020-02-04 (×4): 3.375 g via INTRAVENOUS
  Filled 2020-02-03 (×10): qty 50

## 2020-02-03 MED ORDER — CHLORHEXIDINE GLUCONATE CLOTH 2 % EX PADS
6.0000 | MEDICATED_PAD | Freq: Every day | CUTANEOUS | Status: DC
  Administered 2020-02-03 – 2020-02-07 (×4): 6 via TOPICAL

## 2020-02-03 NOTE — Progress Notes (Signed)
69 year old male from nursing home, presented with sepsis, found to have ESBL E. coli UTI.  Patient was on Levophed which was weaned off and currently patient is on midodrine and IV Zosyn.  Vital signs stable, downgraded from ICU today to be taken over by hospitalist tomorrow 02/04/2020

## 2020-02-03 NOTE — TOC Progression Note (Addendum)
Transition of Care [facility]) - Progression Note    Patient Details  Name: CLIF SERIO MRN: [id number] Date of Birth: 22-Aug-1951  Transition of Care [facility]) CM/SW Contact  Liliana Cline, LCSW Phone Number: 02/03/2020, 3:13 PM  Clinical Narrative:   Patient has VA services per chart. CSW called VA Representative Steward Drone to inform her of patient being at [facility]. Will send email notification as well. Attempted to talk to wife today but she had a death in the family and was upset. Will need to address whether patient is eligible for/willing for VA transfer tomorrow and complete paperwork.  3:45- Return call from [facility]. Per Steward Drone, patient is not service connected at this time and has Medicare Part A and B. She reported it is up to patient if he wants to transfer to a [facility] or not, just need to talk to patient about it and send form with his decision. She reported there is no need to discuss transfer if patient is going to discharge within 24-48 hours. She reported she will put information into their system that patient is at [facility] and plan to discharge back to [facility] at discharge.    Expected Discharge Plan: Skilled Nursing Facility Barriers to Discharge: Continued Medical Work up  Expected Discharge Plan and Services Expected Discharge Plan: Skilled Nursing Facility       Living arrangements for the past 2 months: Skilled Nursing Facility                                       Social Determinants of Health (SDOH) Interventions    Readmission Risk Interventions No flowsheet data found.

## 2020-02-03 NOTE — TOC Initial Note (Addendum)
Transition of Care [facility]) - Initial/Assessment Note    Patient Details  Name: Alan Newman MRN: [id number] Date of Birth: 1950-10-22  Transition of Care [facility]) CM/SW Contact:    Liliana Cline, LCSW Phone Number: 02/03/2020, 11:52 AM  Clinical Narrative:                Patient is on Contact Precautions. CSW spoke with patient's wife, [name], via phone. [name] reported she has a lot going on as her son just passed away. Asked if CSW should call back at another time, [name] said no.  [name] confirmed that patient is from [facility]. Per rounds, patient stays at [facility] sometimes and with [name] sometimes. CSW asked about this and [name] reported when patient discharges he will go back to [facility].   CSW left voicemail for [employer].  Per notes, patient is also followed by [facility] for [facility]. CSW left voicemail for [facility].    12:45- Received update from Alvino Chapel that patient stopped services with [facility] on 11/24/19. She reported it looks like patient was picked up by [facility].   CSW called [employer] Melissa to see if patient is active with them. Melissa reported she will have a Case Manager call CSW with this information.    2:45- CSW received a call from [facility] Social Worker Mitchell Heir [phone]). Mitchell Heir reported patient was at a respite stay at [facility] prior to this hospitalization and was active with [facility]. Mitchell Heir reported since patient was hospitalized, patient was dropped from their services, but that they can pick him back up once he discharges back to [facility] Mitchell Heir reported patient's wife cannot care for patient at home due to her physical health and having a lot going on, and it would be "an APS situation" if patient does discharge home. CSW is waiting to hear back from [place] with [facility] about accepting patient  back when he is discharged.    3:20- Spoke with [employer] who reported patient could return there to finish his respite stay at discharge, he has 2 days left. She reported per her records patient has Medicare as well as VA benefits, so he could opt to use his Medicare days after those 2 respite days are up.    Expected Discharge Plan: Skilled Nursing Facility Barriers to Discharge: Continued Medical Work up   Patient Goals and CMS Choice     Choice offered to / list presented to : Spouse  Expected Discharge Plan and Services Expected Discharge Plan: Skilled Nursing Facility       Living arrangements for the past 2 months: Skilled Nursing Facility                                      Prior Living Arrangements/Services Living arrangements for the past 2 months: Skilled Nursing Facility Lives with:: Facility Resident Patient language and need for interpreter reviewed:: Yes        Need for Family Participation in Patient Care: Yes (Comment) Care giver support system in place?: Yes (comment) Current home services: Hospice Criminal Activity/Legal Involvement Pertinent to Current Situation/Hospitalization: No - Comment as needed  Activities of Daily Living      Permission Sought/Granted Permission sought to share information with : Photographer granted to share info  w AGENCY: [facility]; Hospice        Emotional Assessment         Alcohol / Substance Use: Not Applicable Psych Involvement: No (comment)  Admission diagnosis:  Dehydration [E86.0] Lower urinary tract infectious disease [N39.0] AKI (acute kidney injury) ([place]) [N17.9] Septic shock ([place]) [A41.9, R65.21] Severe sepsis ([place]) [A41.9, R65.20] Patient Active Problem List   Diagnosis Date Noted  . Septic shock ([place]) 02/02/2020  . Severe sepsis ([place]) 02/01/2020  . Dehydration   . UTI (urinary tract infection) 09/10/2019  .  Non-intractable vomiting   . HTN (hypertension), malignant   . Leukocytosis   . Goals of care, counseling/discussion   . Palliative care by specialist   . DNR (do not resuscitate) discussion   . Chest pain 06/29/2019  . HTN (hypertension), benign 06/29/2019  . Type 2 diabetes mellitus ([place]) 06/29/2019  . Hyperglycemia 06/29/2019  . Protein-calorie malnutrition, severe 08/30/2018  . AKI (acute kidney injury) ([place]) 08/27/2018   PCP:  [place]:   [id number] [place], [address] [place] [address] Phone: [phone] Fax: [phone]     Social Determinants of Health (SDOH) Interventions    Readmission Risk Interventions No flowsheet data found.

## 2020-02-03 NOTE — Consult Note (Signed)
Name: Alan Newman MRN: [id number] DOB: Feb 21, 1951    ADMISSION DATE:  02/01/2020 CONSULTATION DATE: 02/01/2020  REFERRING MD : Manuela Schwartz, NP   CHIEF COMPLAINT: Hypotension   BRIEF PATIENT DESCRIPTION:  69 yo male admitted with hypotension secondary to urosepis requiring levophed gtt   SIGNIFICANT EVENTS /STUDIES:  05/10: Pt initially admitted to [facility] unit with UTI, however while awaiting bed availability in the ER pt became hypotensive requiring levophed gtt   5/11 remains on pressors 5/12 off vasopressors  CC follow up SEPTIC SHOCK HPI Alert, lethargic off vasopressors   PAST MEDICAL HISTORY :   has a past medical history of Diabetes mellitus without complication (HCC), Hypertension, and Stroke (H[facility]).  has a past surgical history that includes Cardiac surgery and Brain surgery. Prior to Admission medications   Medication Sig Start Date End Date Taking? Authorizing Provider  insulin glargine (LANTUS) 100 unit/mL SOPN Inject 0.15 mLs (15 Units total) into the skin daily. 07/02/19  Yes Altamese Dilling, MD  lisinopril (ZESTRIL) 10 MG tablet Take 10 mg by mouth 2 (two) times daily.   Yes [provider]  metFORMIN (GLUCOPHAGE) 1000 MG tablet Take 1,000 mg by mouth daily.   Yes [provider]  mirtazapine (REMERON) 30 MG tablet Take by mouth. 10/06/18  Yes [provider]  ondansetron (ZOFRAN) 4 MG tablet Take 4 mg by mouth every 6 (six) hours as needed for nausea or vomiting.   Yes [provider]  tamsulosin (FLOMAX) 0.4 MG CAPS capsule Take 0.4 mg by mouth at bedtime.   Yes [provider]  traZODone (DESYREL) 50 MG tablet Take 50 mg by mouth at bedtime.   Yes [provider]  hyoscyamine (LEVSIN) 0.125 MG tablet Take 0.125 mg by mouth every 6 (six) hours as needed for cramping.    [provider]   No Known Allergies  FAMILY HISTORY:  family history includes Cancer in his father; Dementia  in his father; Kidney disease in his mother; Stomach cancer in his sister. SOCIAL HISTORY:  reports that he has never smoked. He has quit using smokeless tobacco.  His smokeless tobacco use included chew. He reports that he does not drink alcohol or use drugs.   Review of Systems:  Gen:  Denies  fever, sweats, chills weight loss  HEENT: Denies blurred vision, double vision, ear pain, eye pain, hearing loss, nose bleeds, sore throat Cardiac:  No dizziness, chest pain or heaviness, chest tightness,edema, No JVD Resp:   No cough, -sputum production, -shortness of breath,-wheezing, -hemoptysis,  Other:  All other systems negative   VITAL SIGNS: Temp:  [98 F (36.7 C)-98.7 F (37.1 C)] 98.7 F (37.1 C) (05/12 0400) Pulse Rate:  [70-104] 89 (05/12 0700) Resp:  [0-21] 0 (05/12 0700) BP: (91-128)/(52-81) 101/73 (05/12 0700) SpO2:  [93 %-100 %] 95 % (05/12 0700)   Physical Examination:   General Appearance: No distress  Neuro:without focal findings,  speech normal,  HEENT: PERRLA, EOM intact.   Pulmonary: normal breath sounds, No wheezing.  CardiovascularNormal S1,S2.  No m/r/g.   Abdomen: Benign, Soft, non-tender. Renal:  No costovertebral tenderness  GU:  Not performed at this time. Endoc: No evident thyromegaly Skin:   warm, no rashes, no ecchymosis  Extremities: able to move bilateral lower extremities, bed bound at baseline secondary to previous stroke  PSYCHIATRIC: Mood, affect within normal limits.   ALL OTHER ROS ARE NEGATIVE      Recent Labs  Lab 02/01/20 1530 02/02/20 0550  NA 135 138  K 4.9 4.2  CL 89* 99  CO2 34* 31  BUN 33* 27*  CREATININE 3.21* 1.84*  GLUCOSE 266* 218*   Recent Labs  Lab 02/01/20 1530 02/02/20 0550  HGB 13.8 12.0*  HCT 43.6 36.9*  WBC 8.7 9.4  PLT 257 270   DG Chest Port 1 View  Result Date: 02/01/2020 CLINICAL DATA:  Hypoxia. EXAM: PORTABLE CHEST 1 VIEW COMPARISON:  June 28, 2019 FINDINGS: Multiple sternal wires are seen.  Radiopaque fixation plates and screws are seen overlying the anterior and lateral aspects of multiple left ribs. Additional chronic rib deformities are seen on the left. There is no evidence of acute infiltrate, pleural effusion or pneumothorax. The heart size and mediastinal contours are within normal limits. Multilevel degenerative changes seen throughout the thoracic spine. IMPRESSION: 1. Evidence of prior median sternotomy/CABG. 2. Multiple chronic left-sided rib deformities. Electronically Signed   By: Virgina Norfolk M.D.   On: 02/01/2020 16:02    ASSESSMENT / PLAN:  SEPTIC SHOCK DUE TO UTI RESOLVED IVF's a needed  ACUTE KIDNEY INJURY/Renal Failure -follow chem 7 -follow UO -continue Foley Catheter-assess need -Avoid nephrotoxic agents   Chronically bed bound secondary to previous stroke   ELECTROLYTES -follow labs as needed -replace as needed -pharmacy consultation and following    DVT/GI PRX ordered TRANSFUSIONS AS NEEDED MONITOR FSBS ASSESS the need for LABS as needed   TRANSFER TO [facility] 5/13  Corrin Parker, M.D.  [facility]  Medical Director [place] Director [facility] Cardio-Pulmonary Department

## 2020-02-03 NOTE — Progress Notes (Signed)
Patient had two episodes of nausea/vomiting, prn zofran given with relief. Norepi gtt turned out at beginning of shift after fluid bolus.

## 2020-02-04 ENCOUNTER — Inpatient Hospital Stay: Payer: No Typology Code available for payment source

## 2020-02-04 DIAGNOSIS — I9589 Other hypotension: Secondary | ICD-10-CM

## 2020-02-04 DIAGNOSIS — E861 Hypovolemia: Secondary | ICD-10-CM

## 2020-02-04 LAB — CORTISOL-PM, BLOOD: Cortisol - PM: 20 ug/dL — ABNORMAL HIGH (ref <10.0)

## 2020-02-04 LAB — BASIC METABOLIC PANEL
Anion gap: 7 (ref 5–15)
BUN: 10 mg/dL (ref 8–23)
CO2: 27 mmol/L (ref 22–32)
Calcium: 8.7 mg/dL — ABNORMAL LOW (ref 8.9–10.3)
Chloride: 104 mmol/L (ref 98–111)
Creatinine, Ser: 0.7 mg/dL (ref 0.61–1.24)
GFR calc Af Amer: >60 mL/min (ref >60)
GFR calc non Af Amer: >60 mL/min (ref >60)
Glucose, Bld: 71 mg/dL (ref 70–99)
Potassium: 3.7 mmol/L (ref 3.5–5.1)
Sodium: 138 mmol/L (ref 135–145)

## 2020-02-04 LAB — CBC
HCT: 32.8 % — ABNORMAL LOW (ref 39.0–52.0)
Hemoglobin: 11.1 g/dL — ABNORMAL LOW (ref 13.0–17.0)
MCH: 29.9 pg (ref 26.0–34.0)
MCHC: 33.8 g/dL (ref 30.0–36.0)
MCV: 88.4 fL (ref 80.0–100.0)
Platelets: 164 10*3/uL (ref 150–400)
RBC: 3.71 MIL/uL — ABNORMAL LOW (ref 4.22–5.81)
RDW: 14 % (ref 11.5–15.5)
WBC: 4.6 10*3/uL (ref 4.0–10.5)
nRBC: 0 % (ref 0.0–0.2)

## 2020-02-04 LAB — GLUCOSE, CAPILLARY
Glucose-Capillary: 79 mg/dL (ref 70–99)
Glucose-Capillary: 137 mg/dL — ABNORMAL HIGH (ref 70–99)
Glucose-Capillary: 181 mg/dL — ABNORMAL HIGH (ref 70–99)
Glucose-Capillary: 200 mg/dL — ABNORMAL HIGH (ref 70–99)

## 2020-02-04 MED ORDER — SULFAMETHOXAZOLE-TRIMETHOPRIM 800-160 MG PO TABS
1.0000 | ORAL_TABLET | Freq: Two times a day (BID) | ORAL | Status: DC
  Administered 2020-02-04 – 2020-02-06 (×4): 1 via ORAL
  Filled 2020-02-04 (×5): qty 1

## 2020-02-04 MED ORDER — HYDRALAZINE HCL 20 MG/ML IJ SOLN
10.0000 mg | Freq: Four times a day (QID) | INTRAMUSCULAR | Status: DC | PRN
  Administered 2020-02-05 (×2): 10 mg via INTRAVENOUS
  Filled 2020-02-04 (×2): qty 1

## 2020-02-04 MED ORDER — INSULIN GLARGINE 100 UNIT/ML ~~LOC~~ SOLN
10.0000 [IU] | Freq: Every day | SUBCUTANEOUS | Status: DC
  Administered 2020-02-05 – 2020-02-07 (×3): 10 [IU] via SUBCUTANEOUS
  Filled 2020-02-04 (×4): qty 0.1

## 2020-02-04 MED ORDER — MAGNESIUM OXIDE 400 (241.3 MG) MG PO TABS
400.0000 mg | ORAL_TABLET | Freq: Every day | ORAL | Status: DC
  Administered 2020-02-04 – 2020-02-07 (×4): 400 mg via ORAL
  Filled 2020-02-04 (×4): qty 1

## 2020-02-04 MED ORDER — MIDODRINE HCL 5 MG PO TABS
5.0000 mg | ORAL_TABLET | Freq: Three times a day (TID) | ORAL | Status: DC
  Filled 2020-02-04: qty 1

## 2020-02-04 MED ORDER — POLYETHYLENE GLYCOL 3350 17 G PO PACK
17.0000 g | PACK | Freq: Every day | ORAL | Status: DC
  Administered 2020-02-04 – 2020-02-07 (×2): 17 g via ORAL
  Filled 2020-02-04 (×3): qty 1

## 2020-02-04 MED ORDER — GABAPENTIN 100 MG PO CAPS
200.0000 mg | ORAL_CAPSULE | Freq: Every day | ORAL | Status: DC
  Administered 2020-02-04 – 2020-02-07 (×4): 200 mg via ORAL
  Filled 2020-02-04 (×4): qty 2

## 2020-02-04 MED ORDER — MORPHINE SULFATE ER 30 MG PO TBCR: 30.0000 mg | EXTENDED_RELEASE_TABLET | Freq: Two times a day (BID) | ORAL | Status: DC

## 2020-02-04 MED ORDER — ATORVASTATIN CALCIUM 20 MG PO TABS
40.0000 mg | ORAL_TABLET | Freq: Every day | ORAL | Status: DC
  Administered 2020-02-04 – 2020-02-07 (×4): 40 mg via ORAL
  Filled 2020-02-04 (×4): qty 2

## 2020-02-04 MED ORDER — INSULIN GLARGINE 100 UNIT/ML ~~LOC~~ SOLN
10.0000 [IU] | Freq: Every day | SUBCUTANEOUS | Status: DC
  Filled 2020-02-04: qty 0.1

## 2020-02-04 MED ORDER — MORPHINE SULFATE (CONCENTRATE) 10 MG/0.5ML PO SOLN: 5.0000 mg | ORAL | Status: DC | PRN

## 2020-02-04 MED ORDER — LORAZEPAM 0.5 MG PO TABS: 0.5000 mg | ORAL_TABLET | Freq: Four times a day (QID) | ORAL | Status: DC | PRN

## 2020-02-04 MED ORDER — ASPIRIN 81 MG PO CHEW
81.0000 mg | CHEWABLE_TABLET | Freq: Every day | ORAL | Status: DC
  Administered 2020-02-04 – 2020-02-07 (×4): 81 mg via ORAL
  Filled 2020-02-04 (×4): qty 1

## 2020-02-04 MED ORDER — FOLIC ACID 1 MG PO TABS
1.0000 mg | ORAL_TABLET | Freq: Every day | ORAL | Status: DC
  Administered 2020-02-04 – 2020-02-07 (×4): 1 mg via ORAL
  Filled 2020-02-04 (×4): qty 1

## 2020-02-04 MED ORDER — TRAZODONE HCL 50 MG PO TABS: 50.0000 mg | ORAL_TABLET | Freq: Every day | ORAL | Status: DC
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
## 2020-02-04 NOTE — TOC Progression Note (Signed)
Transition of Care [facility]) - Progression Note    Patient Details  Name: Alan Newman MRN: [id number] Date of Birth: [birth year]  Transition of Care [facility]) CM/SW Contact  Kienan Doublin, Lemar Livings, LCSW Phone Number: 02/04/2020, 12:01 PM  Clinical Narrative: Clovis Cao sent over to [facility], plan to go there once medically ready for discharge from the hospital. Has two more respite days left then can utilize his Medicare days. Left message for wife.      Expected Discharge Plan: Skilled Nursing Facility Barriers to Discharge: Continued Medical Work up  Expected Discharge Plan and Services Expected Discharge Plan: Skilled Nursing Facility       Living arrangements for the past 2 months: Skilled Nursing Facility                                       Social Determinants of Health (SDOH) Interventions    Readmission Risk Interventions No flowsheet data found.
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
## 2020-02-04 NOTE — NC FL2 (Signed)
[place] LEVEL OF CARE SCREENING TOOL     IDENTIFICATION  Patient Name: Alan Newman Birthdate: [birth year] Sex: male Admission Date (Current Location): 02/01/2020  [place] and [place] Number:  Engineering geologist and Address:  [facility], [address], [place], [place] [address]      Provider Number: [id number]  Attending Physician Name and Address:  Val Riles, MD  Relative Name and Phone Number:  Jaeveon Ashland [phone]    Current Level of Care: Hospital Recommended Level of Care: [place] Prior Approval Number:    Date Approved/Denied:   PASRR Number: [id number]  Discharge Plan: SNF    Current Diagnoses: Patient Active Problem List   Diagnosis Date Noted  . Septic shock ([place]) 02/02/2020  . Severe sepsis ([place]) 02/01/2020  . Dehydration   . UTI (urinary tract infection) 09/10/2019  . Non-intractable vomiting   . HTN (hypertension), malignant   . Leukocytosis   . Goals of care, counseling/discussion   . Palliative care by specialist   . DNR (do not resuscitate) discussion   . Chest pain 06/29/2019  . HTN (hypertension), benign 06/29/2019  . Type 2 diabetes mellitus ([place]) 06/29/2019  . Hyperglycemia 06/29/2019  . Protein-calorie malnutrition, severe 08/30/2018  . AKI (acute kidney injury) ([place]) 08/27/2018    Orientation RESPIRATION BLADDER Height & Weight        Normal External catheter Weight: 151 lb 7.3 oz (68.7 kg) Height:  5\' 9"  (175.3 cm)  BEHAVIORAL SYMPTOMS/MOOD NEUROLOGICAL BOWEL NUTRITION STATUS      Incontinent Diet(Heart Healthy thin liquids)  AMBULATORY STATUS COMMUNICATION OF NEEDS Skin   Total Care Verbally Normal                       Personal Care Assistance Level of Assistance  Bathing, Dressing Bathing Assistance: Maximum assistance   Dressing Assistance: Maximum assistance     Functional Limitations Info             SPECIAL CARE FACTORS  FREQUENCY                       Contractures      Additional Factors Info  Code Status, Allergies Code Status Info: Full Code Allergies Info: No known allergies           Current Medications (02/04/2020):  This is the current hospital active medication list Current Facility-Administered Medications  Medication Dose Route Frequency Provider Last Rate Last Admin  . 0.9 %  sodium chloride infusion   Intravenous Continuous Nicole Kindred A, DO 100 mL/hr at 02/04/20 0300 Rate Verify at 02/04/20 0300  . 0.9 %  sodium chloride infusion  250 mL Intravenous Continuous Sharion Settler, NP   Stopped at 02/02/20 0153  . acetaminophen (TYLENOL) tablet 650 mg  650 mg Oral Q6H PRN Nicole Kindred A, DO       Or  . acetaminophen (TYLENOL) suppository 650 mg  650 mg Rectal Q6H PRN Nicole Kindred A, DO      . bisacodyl (DULCOLAX) EC tablet 5 mg  5 mg Oral Daily PRN Nicole Kindred A, DO      . Chlorhexidine Gluconate Cloth 2 % PADS 6 each  6 each Topical [id number] Flora Lipps, MD   6 each at 02/03/20 1204  . heparin injection 5,000 Units  5,000 Units Subcutaneous Q8H Nicole Kindred A, DO   5,000 Units at 02/04/20 0559  . hydrALAZINE (APRESOLINE) injection 10 mg  10 mg Intravenous Q6H PRN Gillis Santa, MD      . HYDROcodone-acetaminophen (NORCO/VICODIN) 5-325 MG per tablet 1-2 tablet  1-2 tablet Oral Q4H PRN Pennie Banter, DO      . hyoscyamine (ANASPAZ) disintergrating tablet 0.125 mg  0.125 mg Oral Q6H PRN Eugenie Norrie, NP      . insulin aspart (novoLOG) injection 0-5 Units  0-5 Units Subcutaneous QHS Esaw Grandchild A, DO      . insulin aspart (novoLOG) injection 0-9 Units  0-9 Units Subcutaneous TID WC Esaw Grandchild A, DO   1 Units at 02/03/20 0753  . [START ON 02/05/2020] insulin glargine (LANTUS) injection 10 Units  10 Units Subcutaneous Daily Gillis Santa, MD      . midodrine (PROAMATINE) tablet 5 mg  5 mg Oral TID WC Gillis Santa, MD   Stopped at 02/04/20 [phone]  . mupirocin  ointment (BACTROBAN) 2 % 1 application  1 application Nasal BID Erin Fulling, MD   1 application at 02/04/20 [phone]  . ondansetron (ZOFRAN) tablet 4 mg  4 mg Oral Q6H PRN Esaw Grandchild A, DO       Or  . ondansetron (ZOFRAN) injection 4 mg  4 mg Intravenous Q6H PRN Esaw Grandchild A, DO   4 mg at 02/03/20 0420  . piperacillin-tazobactam (ZOSYN) IVPB 3.375 g  3.375 g Intravenous Q8H Kasa, Wallis Bamberg, MD 12.5 mL/hr at 02/04/20 0631 3.375 g at 02/04/20 0631  . polyethylene glycol (MIRALAX / GLYCOLAX) packet 17 g  17 g Oral Daily PRN Esaw Grandchild A, DO      . tamsulosin (FLOMAX) capsule 0.4 mg  0.4 mg Oral QHS Eugenie Norrie, NP   0.4 mg at 02/03/20 2147  . traZODone (DESYREL) tablet 50 mg  50 mg Oral QHS PRN Pennie Banter, DO         Discharge Medications: Please see discharge summary for a list of discharge medications.  Relevant Imaging Results:  Relevant Lab Results:   Additional Information SSN: [id number]  Trinita Devlin, Lemar Livings, LCSW

## 2020-02-05 LAB — BASIC METABOLIC PANEL
Anion gap: 8 (ref 5–15)
BUN: 7 mg/dL — ABNORMAL LOW (ref 8–23)
CO2: 25 mmol/L (ref 22–32)
Calcium: 9 mg/dL (ref 8.9–10.3)
Chloride: 104 mmol/L (ref 98–111)
Creatinine, Ser: 0.52 mg/dL — ABNORMAL LOW (ref 0.61–1.24)
GFR calc Af Amer: >60 mL/min (ref >60)
GFR calc non Af Amer: >60 mL/min (ref >60)
Glucose, Bld: 203 mg/dL — ABNORMAL HIGH (ref 70–99)
Potassium: 3.8 mmol/L (ref 3.5–5.1)
Sodium: 137 mmol/L (ref 135–145)

## 2020-02-05 LAB — CBC
HCT: 35.2 % — ABNORMAL LOW (ref 39.0–52.0)
Hemoglobin: 11.7 g/dL — ABNORMAL LOW (ref 13.0–17.0)
MCH: 29.5 pg (ref 26.0–34.0)
MCHC: 33.2 g/dL (ref 30.0–36.0)
MCV: 88.7 fL (ref 80.0–100.0)
Platelets: 195 10*3/uL (ref 150–400)
RBC: 3.97 MIL/uL — ABNORMAL LOW (ref 4.22–5.81)
RDW: 14 % (ref 11.5–15.5)
WBC: 4.8 10*3/uL (ref 4.0–10.5)
nRBC: 0 % (ref 0.0–0.2)

## 2020-02-05 LAB — GLUCOSE, CAPILLARY
Glucose-Capillary: 184 mg/dL — ABNORMAL HIGH (ref 70–99)
Glucose-Capillary: 212 mg/dL — ABNORMAL HIGH (ref 70–99)
Glucose-Capillary: 212 mg/dL — ABNORMAL HIGH (ref 70–99)
Glucose-Capillary: 258 mg/dL — ABNORMAL HIGH (ref 70–99)
Glucose-Capillary: 309 mg/dL — ABNORMAL HIGH (ref 70–99)

## 2020-02-05 LAB — PHOSPHORUS: Phosphorus: 3.1 mg/dL (ref 2.5–4.6)

## 2020-02-05 LAB — CORTISOL-AM, BLOOD: Cortisol - AM: 16.9 ug/dL (ref 6.7–22.6)

## 2020-02-05 LAB — MAGNESIUM: Magnesium: 1.4 mg/dL — ABNORMAL LOW (ref 1.7–2.4)

## 2020-02-05 LAB — TSH: TSH: 1.139 u[IU]/mL (ref 0.350–4.500)

## 2020-02-05 MED ORDER — LISINOPRIL 10 MG PO TABS
10.0000 mg | ORAL_TABLET | Freq: Every day | ORAL | Status: DC
  Administered 2020-02-05 – 2020-02-07 (×3): 10 mg via ORAL
  Filled 2020-02-05 (×3): qty 1

## 2020-02-05 MED ORDER — MAGNESIUM SULFATE 2 GM/50ML IV SOLN
2.0000 g | INTRAVENOUS | Status: AC
  Administered 2020-02-05 (×2): 2 g via INTRAVENOUS
  Filled 2020-02-05: qty 50
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
## 2020-02-06 ENCOUNTER — Other Ambulatory Visit: Payer: Self-pay

## 2020-02-06 DIAGNOSIS — I959 Hypotension, unspecified: Secondary | ICD-10-CM | POA: Diagnosis present

## 2020-02-06 DIAGNOSIS — I10 Essential (primary) hypertension: Secondary | ICD-10-CM

## 2020-02-06 DIAGNOSIS — E1165 Type 2 diabetes mellitus with hyperglycemia: Secondary | ICD-10-CM

## 2020-02-06 DIAGNOSIS — Z794 Long term (current) use of insulin: Secondary | ICD-10-CM

## 2020-02-06 LAB — BASIC METABOLIC PANEL
Anion gap: 4 — ABNORMAL LOW (ref 5–15)
BUN: 6 mg/dL — ABNORMAL LOW (ref 8–23)
CO2: 26 mmol/L (ref 22–32)
Calcium: 8.3 mg/dL — ABNORMAL LOW (ref 8.9–10.3)
Chloride: 106 mmol/L (ref 98–111)
Creatinine, Ser: 0.63 mg/dL (ref 0.61–1.24)
GFR calc Af Amer: >60 mL/min (ref >60)
GFR calc non Af Amer: >60 mL/min (ref >60)
Glucose, Bld: 229 mg/dL — ABNORMAL HIGH (ref 70–99)
Potassium: 4.3 mmol/L (ref 3.5–5.1)
Sodium: 136 mmol/L (ref 135–145)

## 2020-02-06 LAB — CBC
HCT: 33 % — ABNORMAL LOW (ref 39.0–52.0)
Hemoglobin: 10.9 g/dL — ABNORMAL LOW (ref 13.0–17.0)
MCH: 29.8 pg (ref 26.0–34.0)
MCHC: 33 g/dL (ref 30.0–36.0)
MCV: 90.2 fL (ref 80.0–100.0)
Platelets: 194 10*3/uL (ref 150–400)
RBC: 3.66 MIL/uL — ABNORMAL LOW (ref 4.22–5.81)
RDW: 14.4 % (ref 11.5–15.5)
WBC: 4.1 10*3/uL (ref 4.0–10.5)
nRBC: 0 % (ref 0.0–0.2)

## 2020-02-06 LAB — THYROID PANEL WITH TSH
Free Thyroxine Index: 1.8 (ref 1.2–4.9)
T3 Uptake Ratio: 25 % (ref 24–39)
T4, Total: 7.3 ug/dL (ref 4.5–12.0)
TSH: 0.558 u[IU]/mL (ref 0.450–4.500)

## 2020-02-06 LAB — ACTH
C206 ACTH: 30.4 pg/mL (ref 7.2–63.3)
C206 ACTH: 21.7 pg/mL (ref 7.2–63.3)

## 2020-02-06 LAB — CULTURE, BLOOD (ROUTINE X 2): Culture: NO GROWTH

## 2020-02-06 LAB — GLUCOSE, CAPILLARY
Glucose-Capillary: 217 mg/dL — ABNORMAL HIGH (ref 70–99)
Glucose-Capillary: 265 mg/dL — ABNORMAL HIGH (ref 70–99)
Glucose-Capillary: 321 mg/dL — ABNORMAL HIGH (ref 70–99)
Glucose-Capillary: 284 mg/dL — ABNORMAL HIGH (ref 70–99)

## 2020-02-06 LAB — PHOSPHORUS: Phosphorus: 2.9 mg/dL (ref 2.5–4.6)

## 2020-02-06 LAB — MAGNESIUM: Magnesium: 1.8 mg/dL (ref 1.7–2.4)

## 2020-02-06 NOTE — Progress Notes (Signed)
[facility]  PROGRESS NOTE  JAHAN FRIEDLANDER DDU:[id number] DOB: 12-Dec-1950 DOA: 02/01/2020 PCP: [facility] Admit date - 02/01/2020   Admitting Physician No admitting provider for patient encounter.  Outpatient Primary MD for the patient is [facility]  LOS - 5 Brief Narrative   Alan Newman is a 69 y.o. year old male with medical history significant for CAD, S/p CABG, HTN, DM, CVA, hydrocephalus with VP shunt who presented on 02/01/2020 with weakness, blurry vision, low blood pressure, and was found to have hypotension with lactic acidosis of 3.5, UA concerning for infection (moderate leukocytes, greater than 50 WBCs, negative nitrite, rare bacteria), started empirically on vancomycin and cefepime as well as Levophed for septic shock due to ESBL UTI and admitted to ICU.  Patient was transferred to [facility] management on 5/13 from ICU     Subjective  Today has no acute complaints.  Denies any shortness of breath, no cough, no dysuria  A & P  ESBL UTI.  No bacteremia.  Transitioned from Zosyn to Bactrim per ID recommendations on 5/14 as they doubt patient has an active infection, given unremarkable UA, procalcitonin less than 0.10  -Discontinue Bactrim as recommended by ID  Septic shock resumed due to ESBL UTI, resolved.  Status post Levophed in critical care unit.  Blood pressures normotensive currently.  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  Random cortisol within normal limits.  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  Encouraged by normal glucose, normal potassium, normotensive blood pressures currently -ACTH pending -discontinue IV fluids  Hypertension.  No longer having hypotensive blood pressure episodes. -Continue home lisinopril  AKI, resolved.  Peak creatinine of 3.15, back to baseline renal ultrasound  unremarkable.  In the setting of septic shock.  Nonobstructive right renal calculus.  Noted on renal ultrasound. -Continue Flomax  Poorly controlled type 2 diabetes, A1c 9.5%. -Continue Lantus 10 units, sliding scale, monitor CBGs   Bedbound status.  History of hemorrhagic stroke.  No new focal deficits. -Anticipate discharge back to facility in 24 hours if remains clinically stable      Family Communication  : None  Code Status : Full  Disposition Plan  :  Patient is from [facility] anticipated d/c date:  To 48 hours. Barriers to d/c or necessity for inpatient status:  Close monitoring of blood pressure, fever, clinical status with transition to oral antibiotics Consults  : ID  Procedures  :    DVT Prophylaxis  : Heparin  Lab Results  Component Value Date   PLT 194 02/06/2020    Diet :  Diet Order            Diet heart healthy/carb modified Room service appropriate? Yes; Fluid consistency: Thin  Diet effective now               Inpatient Medications Scheduled Meds: . aspirin  81 mg Oral Daily  . atorvastatin  40 mg Oral Daily  . Chlorhexidine Gluconate Cloth  6 each Topical Q0600  . folic acid  1 mg Oral Daily  . gabapentin  200 mg Oral Daily  . heparin  5,000 Units Subcutaneous Q8H  . insulin aspart  0-5 Units Subcutaneous QHS  . insulin aspart  0-9 Units Subcutaneous TID WC  . insulin glargine  10 Units Subcutaneous Daily  . lisinopril  10 mg Oral Daily  . magnesium oxide  400 mg Oral Daily  .  mupirocin ointment  1 application Nasal BID  . polyethylene glycol  17 g Oral Daily  . tamsulosin  0.4 mg Oral QHS   Continuous Infusions: . sodium chloride 100 mL/hr at 02/06/20 0907  . sodium chloride Stopped (02/02/20 0153)   PRN Meds:.acetaminophen **OR** acetaminophen, bisacodyl, hydrALAZINE, HYDROcodone-acetaminophen, hyoscyamine, LORazepam, morphine CONCENTRATE, ondansetron **OR** ondansetron (ZOFRAN) IV, traZODone  Antibiotics  :   Anti-infectives  (From admission, onward)   Start     Dose/Rate Route Frequency Ordered Stop   02/04/20 2200  sulfamethoxazole-trimethoprim (BACTRIM DS) 800-160 MG per tablet 1 tablet  Status:  Discontinued     1 tablet Oral Every 12 hours 02/04/20 1557 02/06/20 1331   02/03/20 1200  piperacillin-tazobactam (ZOSYN) IVPB 3.375 g  Status:  Discontinued     3.375 g 12.5 mL/hr over 240 Minutes Intravenous Every 8 hours 02/03/20 1036 02/04/20 1557   02/03/20 0600  vancomycin (VANCOCIN) IVPB 1000 mg/200 mL premix  Status:  Discontinued     1,000 mg 200 mL/hr over 60 Minutes Intravenous Every 24 hours 02/02/20 0813 02/02/20 1024   02/02/20 1800  ceFEPIme (MAXIPIME) 1 g in sodium chloride 0.9 % 100 mL IVPB  Status:  Discontinued     1 g 200 mL/hr over 30 Minutes Intravenous Every 24 hours 02/01/20 1816 02/02/20 0813   02/02/20 1000  ceFEPIme (MAXIPIME) 2 g in sodium chloride 0.9 % 100 mL IVPB  Status:  Discontinued     2 g 200 mL/hr over 30 Minutes Intravenous Every 12 hours 02/02/20 0813 02/03/20 1036   02/01/20 2344  vancomycin variable dose per unstable renal function (pharmacist dosing)  Status:  Discontinued      Does not apply See admin instructions 02/01/20 2344 02/02/20 1024   02/01/20 2330  vancomycin (VANCOREADY) IVPB 1500 mg/300 mL     1,500 mg 150 mL/hr over 120 Minutes Intravenous  Once 02/01/20 2304 02/02/20 0815   02/01/20 1800  ceFEPIme (MAXIPIME) 2 g in sodium chloride 0.9 % 100 mL IVPB     2 g 200 mL/hr over 30 Minutes Intravenous  Once 02/01/20 1746 02/01/20 2013   02/01/20 1645  ciprofloxacin (CIPRO) IVPB 400 mg     400 mg 200 mL/hr over 60 Minutes Intravenous  Once 02/01/20 1641 02/01/20 1815       Objective   Vitals:   02/05/20 1018 02/05/20 1621 02/06/20 0042 02/06/20 0821  BP: (!) 148/88 114/86 (!) 136/91 133/88  Pulse:  (!) 103 87 79  Resp:  17 15 17   Temp:  98.4 F (36.9 C) 98.2 F (36.8 C) 98.6 F (37 C)  TempSrc:  Oral Oral Oral  SpO2:  97% 96% 98%  Weight:       Height:        SpO2: 98 % O2 Flow Rate (L/min): 2 L/min  Wt Readings from Last 3 Encounters:  02/02/20 68.7 kg  09/10/19 77.1 kg  07/17/19 72.6 kg     Intake/Output Summary (Last 24 hours) at 02/06/2020 1332 Last data filed at 02/06/2020 0601 Gross per 24 hour  Intake -  Output 900 ml  Net -900 ml    Physical Exam:    Elderly male, lying in bed, no distress Awake Alert, Oriented X 3, Normal affect No new F.N deficits,  [place].AT, Normal respiratory effort on room air, CTAB RRR,No Gallops,Rubs or new Murmurs,  +ve B.Sounds, Abd Soft, No tenderness, No rebound, guarding or rigidity. No Cyanosis, No new Rash or bruise     I have  personally reviewed the following:   Data Reviewed:  CBC Recent Labs  Lab 02/01/20 1530 02/02/20 0550 02/04/20 0447 02/05/20 0812 02/06/20 0719  WBC 8.7 9.4 4.6 4.8 4.1  HGB 13.8 12.0* 11.1* 11.7* 10.9*  HCT 43.6 36.9* 32.8* 35.2* 33.0*  PLT 257 270 164 195 194  MCV 93.6 92.5 88.4 88.7 90.2  MCH 29.6 30.1 29.9 29.5 29.8  MCHC 31.7 32.5 33.8 33.2 33.0  RDW 14.8 14.9 14.0 14.0 14.4  LYMPHSABS 1.5  --   --   --   --   MONOABS 0.7  --   --   --   --   EOSABS 0.4  --   --   --   --   BASOSABS 0.1  --   --   --   --     Chemistries  Recent Labs  Lab 02/01/20 1530 02/02/20 0550 02/04/20 0447 02/05/20 0812 02/06/20 0719  NA 135 138 138 137 136  K 4.9 4.2 3.7 3.8 4.3  CL 89* 99 104 104 106  CO2 34* 31 27 25 26   GLUCOSE 266* 218* 71 203* 229*  BUN 33* 27* 10 7* 6*  CREATININE 3.21* 1.84* 0.70 0.52* 0.63  CALCIUM 8.8* 8.2* 8.7* 9.0 8.3*  MG  --   --   --  1.4* 1.8  AST 19  --   --   --   --   ALT 14  --   --   --   --   ALKPHOS 104  --   --   --   --   BILITOT 0.8  --   --   --   --    ------------------------------------------------------------------------------------------------------------------ No results for input(s): CHOL, HDL, LDLCALC, TRIG, CHOLHDL, LDLDIRECT in the last 72 hours.  Lab Results  Component Value Date    HGBA1C 10.3 (H) 02/01/2020   ------------------------------------------------------------------------------------------------------------------ Recent Labs    02/04/20 1645 02/05/20 0812  TSH 0.558 1.139  T4TOTAL 7.3  --    ------------------------------------------------------------------------------------------------------------------ No results for input(s): VITAMINB12, FOLATE, FERRITIN, TIBC, IRON, RETICCTPCT in the last 72 hours.  Coagulation profile Recent Labs  Lab 02/01/20 1530 02/02/20 0550  INR 1.1 1.1    No results for input(s): DDIMER in the last 72 hours.  Cardiac Enzymes No results for input(s): CKMB, TROPONINI, MYOGLOBIN in the last 168 hours.  Invalid input(s): CK ------------------------------------------------------------------------------------------------------------------ No results found for: BNP  Micro Results Recent Results (from the past 240 hour(s))  Blood Culture (routine x 2)     Status: None   Collection Time: 02/01/20  3:30 PM   Specimen: BLOOD  Result Value Ref Range Status   Specimen Description BLOOD BLOOD LEFT ARM  Final   Special Requests   Final    BOTTLES DRAWN AEROBIC AND ANAEROBIC Blood Culture results may not be optimal due to an inadequate volume of blood received in culture bottles   Culture   Final    NO GROWTH 5 DAYS Performed at [facility], [address], [place], [place] [address]    Report Status 02/06/2020 FINAL  Final  Urine culture     Status: Abnormal   Collection Time: 02/01/20  3:44 PM   Specimen: In/Out Cath Urine  Result Value Ref Range Status   Specimen Description   Final    IN/OUT CATH URINE Performed at [facility], [address] [place], [place] [address]    Special Requests   Final    NONE Performed at [facility], [address] [place], [place] [address]    Culture (A)  Final    >=100,000 COLONIES/mL ESCHERICHIA COLI Confirmed Extended Spectrum  Beta-Lactamase Producer (ESBL).  In bloodstream infections from ESBL organisms, carbapenems are preferred over piperacillin/tazobactam. They are shown to have a lower risk of mortality. MULTI-DRUG RESISTANT ORGANISM    Report Status 02/03/2020 FINAL  Final   Organism ID, Bacteria ESCHERICHIA COLI (A)  Final      Susceptibility   Escherichia coli - MIC*    AMPICILLIN >=32 RESISTANT Resistant     CEFAZOLIN >=64 RESISTANT Resistant     CEFTRIAXONE >=64 RESISTANT Resistant     CIPROFLOXACIN >=4 RESISTANT Resistant     GENTAMICIN <=1 SENSITIVE Sensitive     IMIPENEM <=0.25 SENSITIVE Sensitive     NITROFURANTOIN <=16 SENSITIVE Sensitive     TRIMETH/SULFA <=20 SENSITIVE Sensitive     AMPICILLIN/SULBACTAM >=32 RESISTANT Resistant     PIP/TAZO 8 SENSITIVE Sensitive     * >=100,000 COLONIES/mL ESCHERICHIA COLI  SARS Coronavirus 2 by RT PCR (hospital order, performed in [facility] hospital lab) Nasopharyngeal Nasopharyngeal Swab     Status: None   Collection Time: 02/01/20  4:42 PM   Specimen: Nasopharyngeal Swab  Result Value Ref Range Status   SARS Coronavirus 2 NEGATIVE NEGATIVE Final    Comment: (NOTE) SARS-CoV-2 target nucleic acids are NOT DETECTED. The SARS-CoV-2 RNA is generally detectable in upper and lower respiratory specimens during the acute phase of infection. The lowest concentration of SARS-CoV-2 viral copies this assay can detect is 250 copies / mL. A negative result does not preclude SARS-CoV-2 infection and should not be used as the sole basis for treatment or other patient management decisions.  A negative result may occur with improper specimen collection / handling, submission of specimen other than nasopharyngeal swab, presence of viral mutation(s) within the areas targeted by this assay, and inadequate number of viral copies (<250 copies / mL). A negative result must be combined with clinical observations, patient history, and epidemiological information. Fact Sheet  for Patients:   BoilerBrush.com.cy Fact Sheet for Healthcare Providers: https://pope.com/ This test is not yet approved or cleared  by the [place] FDA and has been authorized for detection and/or diagnosis of SARS-CoV-2 by FDA under an Emergency Use Authorization (EUA).  This EUA will remain in effect (meaning this test can be used) for the duration of the COVID-19 declaration under Section 564(b)(1) of the Act, 21 U.S.C. section 360bbb-3(b)(1), unless the authorization is terminated or revoked sooner. Performed at [facility], [address] [place], [place] [address]   MRSA PCR Screening     Status: Abnormal   Collection Time: 02/02/20  1:35 AM   Specimen: Nasal Mucosa; Nasopharyngeal  Result Value Ref Range Status   MRSA by PCR POSITIVE (A) NEGATIVE Final    Comment:        The GeneXpert MRSA Assay (FDA approved for NASAL specimens only), is one component of a comprehensive MRSA colonization surveillance program. It is not intended to diagnose MRSA infection nor to guide or monitor treatment for MRSA infections. RESULT CALLED TO, READ BACK BY AND VERIFIED WITH: Elon Spanner RN [phone] 02/02/20 HNM Performed at [facility], [address] [place], [place] [address]   Blood Culture (routine x 2)     Status: None (Preliminary result)   Collection Time: 02/02/20  5:50 AM   Specimen: BLOOD RIGHT WRIST  Result Value Ref Range Status   Specimen Description BLOOD RIGHT  WRIST  Final   Special Requests   Final    BOTTLES DRAWN AEROBIC ONLY Blood Culture adequate volume   Culture   Final    NO GROWTH 4 DAYS Performed at [facility], [address], [place], [place] [address]    Report Status PENDING  Incomplete    Radiology Reports US RENAL  Result Date: 02/04/2020 CLINICAL DATA:  Urinary tract infection. EXAM: RENAL / URINARY TRACT ULTRASOUND COMPLETE COMPARISON:  August 27, 2018. FINDINGS:  Right Kidney: Renal measurements: 11.3 x 6.5 x 5.5 cm = volume: 211 mL. 5 mm nonobstructive calculus is seen in midpole. Echogenicity within normal limits. No mass or hydronephrosis visualized. Left Kidney: Renal measurements: 11.5 x 5.7 x 5.3 cm = volume: 181 mL. Echogenicity within normal limits. No mass or hydronephrosis visualized. Bladder: Appears normal for degree of bladder distention. Other: None. IMPRESSION: Nonobstructive right renal calculus. No other renal abnormality is noted. Electronically Signed   By: Lupita Raider M.D.   On: 02/04/2020 13:13   DG Chest Port 1 View  Result Date: 02/01/2020 CLINICAL DATA:  Hypoxia. EXAM: PORTABLE CHEST 1 VIEW COMPARISON:  June 28, 2019 FINDINGS: Multiple sternal wires are seen. Radiopaque fixation plates and screws are seen overlying the anterior and lateral aspects of multiple left ribs. Additional chronic rib deformities are seen on the left. There is no evidence of acute infiltrate, pleural effusion or pneumothorax. The heart size and mediastinal contours are within normal limits. Multilevel degenerative changes seen throughout the thoracic spine. IMPRESSION: 1. Evidence of prior median sternotomy/CABG. 2. Multiple chronic left-sided rib deformities. Electronically Signed   By: Aram Candela M.D.   On: 02/01/2020 16:02     Time Spent in minutes  30     Laverna Peace M.D on 02/06/2020 at 1:32 PM  To page go to [email or web address] - password [email or web address]

## 2020-02-06 NOTE — Plan of Care (Signed)

## 2020-02-07 DIAGNOSIS — R6521 Severe sepsis with septic shock: Secondary | ICD-10-CM

## 2020-02-07 DIAGNOSIS — N179 Acute kidney failure, unspecified: Secondary | ICD-10-CM

## 2020-02-07 DIAGNOSIS — N390 Urinary tract infection, site not specified: Principal | ICD-10-CM

## 2020-02-07 DIAGNOSIS — Z1612 Extended spectrum beta lactamase (ESBL) resistance: Secondary | ICD-10-CM

## 2020-02-07 DIAGNOSIS — I959 Hypotension, unspecified: Secondary | ICD-10-CM

## 2020-02-07 DIAGNOSIS — A498 Other bacterial infections of unspecified site: Secondary | ICD-10-CM

## 2020-02-07 LAB — CULTURE, BLOOD (ROUTINE X 2)
Culture: NO GROWTH
Special Requests: ADEQUATE

## 2020-02-07 LAB — CBC
HCT: 33.3 % — ABNORMAL LOW (ref 39.0–52.0)
Hemoglobin: 10.9 g/dL — ABNORMAL LOW (ref 13.0–17.0)
MCH: 29.6 pg (ref 26.0–34.0)
MCHC: 32.7 g/dL (ref 30.0–36.0)
MCV: 90.5 fL (ref 80.0–100.0)
Platelets: 188 10*3/uL (ref 150–400)
RBC: 3.68 MIL/uL — ABNORMAL LOW (ref 4.22–5.81)
RDW: 14.6 % (ref 11.5–15.5)
WBC: 4.8 10*3/uL (ref 4.0–10.5)
nRBC: 0 % (ref 0.0–0.2)

## 2020-02-07 LAB — GLUCOSE, CAPILLARY
Glucose-Capillary: 233 mg/dL — ABNORMAL HIGH (ref 70–99)
Glucose-Capillary: 268 mg/dL — ABNORMAL HIGH (ref 70–99)
Glucose-Capillary: 288 mg/dL — ABNORMAL HIGH (ref 70–99)
Glucose-Capillary: 338 mg/dL — ABNORMAL HIGH (ref 70–99)

## 2020-02-07 LAB — BASIC METABOLIC PANEL
Anion gap: 6 (ref 5–15)
BUN: 7 mg/dL — ABNORMAL LOW (ref 8–23)
CO2: 24 mmol/L (ref 22–32)
Calcium: 8.1 mg/dL — ABNORMAL LOW (ref 8.9–10.3)
Chloride: 107 mmol/L (ref 98–111)
Creatinine, Ser: 0.61 mg/dL (ref 0.61–1.24)
GFR calc Af Amer: >60 mL/min (ref >60)
GFR calc non Af Amer: >60 mL/min (ref >60)
Glucose, Bld: 244 mg/dL — ABNORMAL HIGH (ref 70–99)
Potassium: 4.7 mmol/L (ref 3.5–5.1)
Sodium: 137 mmol/L (ref 135–145)

## 2020-02-07 LAB — SARS CORONAVIRUS 2 BY RT PCR (HOSPITAL ORDER, PERFORMED IN ~~LOC~~ HOSPITAL LAB): SARS Coronavirus 2: NEGATIVE

## 2020-02-07 LAB — MAGNESIUM: Magnesium: 1.7 mg/dL (ref 1.7–2.4)

## 2020-02-07 LAB — PHOSPHORUS: Phosphorus: 2.8 mg/dL (ref 2.5–4.6)

## 2020-02-07 NOTE — Discharge Summary (Signed)
[name]:[id number] DOB: [birth year] DOA: 02/01/2020  PCP: [facility]  Admit date: 02/01/2020 Discharge date: 02/07/2020  Admitted From: [facility] Disposition: [facility]  Recommendations for Outpatient Follow-up:  1. Follow up with PCP in 1-2 weeks 2.   Home Health: None Equipment/Devices:None  Discharge Condition: Stable CODE STATUS: Full   Brief/Interim Summary: History of present illness:  Alan Newman is a 69 y.o. year old male with medical history significant for CAD, S/p CABG, HTN, DM,CVA, hydrocephalus with VP shunt who presented on 02/01/2020 with weakness, blurry vision, low blood pressure, and was found to have hypotension (58/43 )with lactic acidosis of 3.5, UA concerning for infection (moderate leukocytes, greater than 50 WBCs, negative nitrite, rare bacteria), started empirically on vancomycin and cefepime as well as Levophed for septic shock due to ESBL UTI and admitted to ICU.    Remaining hospital course addressed in problem based format below:   Hospital Course:   ESBL E. COLI UTI.  ID was consulted during hospital stay and felt this was less likely an active infection/sepsis this likely given UA only showed 50 WBCs without bacteria, no fever, no leukocytosis.  No hydronephrosis on renal ultrasound.  Patient received 3 days of antibiotics before transition to Bactrim.  For which ID recommended discontinuation.  Patient remained afebrile off antibiotics prior to discharge   Septic shock presumed related to ESBL UTI.  Did require Levophed incritical care unit before transitioning to IV fluids alone.  Patient was able to maintain normotensive blood pressures off IV fluids prior to discharge. Some concern this was not actually an infection given unremarkable UA with no fever or leukocytosis. Additionally no bacteremia on blood cultures. Patient completed 3 day course of zosyn followed by bactrim as recommended by ID.   Recurrent  hypotensive episodes with nausea andvomiting.  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  Am cortisol  X 2 as well as ACTH were within normal range.  Patient was able to maintain normal blood pressure off pressors and off fluids prior to discharge.  Additionally remained normotensive on home lisinopril  Hypertension, at goal.  During hospital stay was admitted for septic shock requiring Levophed.  On discharge blood pressure at goal -Continue home lisinopril  AKI.  Peak creatinine of 3.21 on admission related to septic shock.  Creatinine back to baseline (0.61 on discharge) -Avoid nephrotoxins -Okay to continue lisinopril  Nonobstructive right renal calculus.  Still having adequate output, not having any pain -Continue home Flomax  Poorly controlled type 2 diabetes.  A1c 9.5% -Continue home regimen of Lantus 15 units, and Metformin (was held during hospitalization)  Bedbound status able.  History of hemorrhagic stroke, no new focal deficits -Anticipate discharge back to [facility]   Consultations:  Infectious disease  Procedures/Studies: None Subjective: Feels well, ready to go home.  Denies any pain. Discharge Exam: Vitals:   02/07/20 0104 02/07/20 0743  BP: (!) 156/99 (!) 134/93  Pulse: 73 76  Resp: 16 17  Temp: 98.8 F (37.1 C) 98 F (36.7 C)  SpO2: 98% 98%   Vitals:   02/06/20 0821 02/06/20 1619 02/07/20 0104 02/07/20 0743  BP: 133/88 109/82 (!) 156/99 (!) 134/93  Pulse: 79 83 73 76  Resp: Temp: 98.6 F (37 C) 98.1 F (36.7 C) 98.8 F (37.1 C) 98 F (36.7 C)  TempSrc: Oral Oral  Oral Oral  SpO2: 98% 97% 98% 98%  Weight:      Height:        Elderly male, lying in bed, no distress Awake Alert, Oriented X 3, Normal affect No new F.N deficits,  [place].AT, Normal respiratory effort on room air,  CTAB RRR,No Gallops,Rubs or new Murmurs,  +ve B.Sounds, Abd Soft, No tenderness, No rebound, guarding or rigidity. No Cyanosis, No new Rash or bruise    Discharge Diagnoses:  Principal Problem:   Severe sepsis (HCC) Active Problems:   AKI (acute kidney injury) (HCC)   HTN (hypertension), benign   Type 2 diabetes mellitus (HCC)   UTI (urinary tract infection)   Septic shock (HCC)   Hypotension   Infection due to ESBL-producing Escherichia coli    Discharge Instructions  Discharge Instructions    Diet - low sodium heart healthy   Complete by: As directed    Increase activity slowly   Complete by: As directed      Allergies as of 02/07/2020   No Known Allergies     Medication List    TAKE these medications   aspirin 81 MG chewable tablet Chew by mouth daily.   atorvastatin 40 MG tablet Commonly known as: LIPITOR Take 40 mg by mouth daily.   folic acid 1 MG tablet Commonly known as: FOLVITE Take 1 mg by mouth daily.   gabapentin 100 MG capsule Commonly known as: NEURONTIN Take 200 mg by mouth daily.   hyoscyamine 0.125 MG tablet Commonly known as: LEVSIN Take 0.125 mg by mouth every 3 (three) hours as needed (secretions).   insulin glargine 100 unit/mL Sopn Commonly known as: LANTUS Inject 0.15 mLs (15 Units total) into the skin daily.   lisinopril 10 MG tablet Commonly known as: ZESTRIL Take 10 mg by mouth daily.   LORazepam 0.5 MG tablet Commonly known as: ATIVAN Take 0.5 mg by mouth every 6 (six) hours as needed for anxiety.   magnesium oxide 400 MG tablet Commonly known as: MAG-OX Take 400 mg by mouth daily.   metFORMIN 1000 MG tablet Commonly known as: GLUCOPHAGE Take 1,000 mg by mouth 2 (two) times daily.   morphine 30 MG 12 hr tablet Commonly known as: MS CONTIN Take 30 mg by mouth every 12 (twelve) hours.   morphine CONCENTRATE 10 mg / 0.5 ml concentrated solution Take 5 mg by mouth every 2 (two) hours as needed for severe pain.    ondansetron 4 MG tablet Commonly known as: ZOFRAN Take 4 mg by mouth 3 (three) times daily as needed for nausea.   ondansetron 4 MG tablet Commonly known as: ZOFRAN Take 4 mg by mouth 2 (two) times daily.   polyethylene glycol 17 g packet Commonly known as: MIRALAX / GLYCOLAX Take 17 g by mouth daily.   tamsulosin 0.4 MG Caps capsule Commonly known as: FLOMAX Take 0.4 mg by mouth daily.   traZODone 50 MG tablet Commonly known as: DESYREL Take 50 mg by mouth at bedtime.      Contact information for after-discharge care    Destination    [facility] .   Service: Skilled Nursing Contact information: [address] [place] [address] [phone]             No Known Allergies      The results of significant diagnostics from this hospitalization (including imaging, microbiology, ancillary and laboratory) are listed below for reference.     Microbiology: Recent Results (from the past  240 hour(s))  Blood Culture (routine x 2)     Status: None   Collection Time: 02/01/20  3:30 PM   Specimen: BLOOD  Result Value Ref Range Status   Specimen Description BLOOD BLOOD LEFT ARM  Final   Special Requests   Final    BOTTLES DRAWN AEROBIC AND ANAEROBIC Blood Culture results may not be optimal due to an inadequate volume of blood received in culture bottles   Culture   Final    NO GROWTH 5 DAYS Performed at [facility], [address] [place], [place] [address]    Report Status 02/06/2020 FINAL  Final  Urine culture     Status: Abnormal   Collection Time: 02/01/20  3:44 PM   Specimen: In/Out Cath Urine  Result Value Ref Range Status   Specimen Description   Final    IN/OUT CATH URINE Performed at [facility], [address] [place], [place] [address]    Special Requests   Final    NONE Performed at [facility], [address] [place], [place] [address]    Culture (A)  Final     >=100,000 COLONIES/mL ESCHERICHIA COLI Confirmed Extended Spectrum Beta-Lactamase Producer (ESBL).  In bloodstream infections from ESBL organisms, carbapenems are preferred over piperacillin/tazobactam. They are shown to have a lower risk of mortality. MULTI-DRUG RESISTANT ORGANISM    Report Status 02/03/2020 FINAL  Final   Organism ID, Bacteria ESCHERICHIA COLI (A)  Final      Susceptibility   Escherichia coli - MIC*    AMPICILLIN >=32 RESISTANT Resistant     CEFAZOLIN >=64 RESISTANT Resistant     CEFTRIAXONE >=64 RESISTANT Resistant     CIPROFLOXACIN >=4 RESISTANT Resistant     GENTAMICIN <=1 SENSITIVE Sensitive     IMIPENEM <=0.25 SENSITIVE Sensitive     NITROFURANTOIN <=16 SENSITIVE Sensitive     TRIMETH/SULFA <=20 SENSITIVE Sensitive     AMPICILLIN/SULBACTAM >=32 RESISTANT Resistant     PIP/TAZO 8 SENSITIVE Sensitive     * >=100,000 COLONIES/mL ESCHERICHIA COLI  SARS Coronavirus 2 by RT PCR (hospital order, performed in [facility] hospital lab) Nasopharyngeal Nasopharyngeal Swab     Status: None   Collection Time: 02/01/20  4:42 PM   Specimen: Nasopharyngeal Swab  Result Value Ref Range Status   SARS Coronavirus 2 NEGATIVE NEGATIVE Final    Comment: (NOTE) SARS-CoV-2 target nucleic acids are NOT DETECTED. The SARS-CoV-2 RNA is generally detectable in upper and lower respiratory specimens during the acute phase of infection. The lowest concentration of SARS-CoV-2 viral copies this assay can detect is 250 copies / mL. A negative result does not preclude SARS-CoV-2 infection and should not be used as the sole basis for treatment or other patient management decisions.  A negative result may occur with improper specimen collection / handling, submission of specimen other than nasopharyngeal swab, presence of viral mutation(s) within the areas targeted by this assay, and inadequate number of viral copies (<250 copies / mL). A negative result must be combined with  clinical observations, patient history, and epidemiological information. Fact Sheet for Patients:   BoilerBrush.com.cyhttps://www.fda.gov/media/136312/download Fact Sheet for Healthcare Providers: https://pope.com/https://www.fda.gov/media/136313/download This test is not yet approved or cleared  by the [place] FDA and has been authorized for detection and/or diagnosis of SARS-CoV-2 by FDA under an Emergency Use Authorization (EUA).  This EUA will remain in effect (meaning this test can be used) for the duration of the COVID-19 declaration under Section 564(b)(1) of the Act, 21  U.S.C. section 360bbb-3(b)(1), unless the authorization is terminated or revoked sooner. Performed at [facility], [address] [place], [place] [address]   MRSA PCR Screening     Status: Abnormal   Collection Time: 02/02/20  1:35 AM   Specimen: Nasal Mucosa; Nasopharyngeal  Result Value Ref Range Status   MRSA by PCR POSITIVE (A) NEGATIVE Final    Comment:        The GeneXpert MRSA Assay (FDA approved for NASAL specimens only), is one component of a comprehensive MRSA colonization surveillance program. It is not intended to diagnose MRSA infection nor to guide or monitor treatment for MRSA infections. RESULT CALLED TO, READ BACK BY AND VERIFIED WITH: Forrest Moron RN [phone] 02/02/20 HNM Performed at [facility], [address] [place], [place] [address]   Blood Culture (routine x 2)     Status: None   Collection Time: 02/02/20  5:50 AM   Specimen: BLOOD RIGHT WRIST  Result Value Ref Range Status   Specimen Description BLOOD RIGHT WRIST  Final   Special Requests   Final    BOTTLES DRAWN AEROBIC ONLY Blood Culture adequate volume   Culture   Final    NO GROWTH 5 DAYS Performed at [facility], [address], [place], [place] [address]    Report Status 02/07/2020 FINAL  Final     Labs: BNP (last 3 results) No results for input(s): BNP in the last 8760 hours. Basic Metabolic Panel: Recent  Labs  Lab 02/02/20 0550 02/04/20 0447 02/05/20 0812 02/06/20 0719 02/07/20 0548  NA 138 138 137 136 137  K 4.2 3.7 3.8 4.3 4.7  CL 99 104 104 106 107  CO2 31 27 25 26 24   GLUCOSE 218* 71 203* 229* 244*  BUN 27* 10 7* 6* 7*  CREATININE 1.84* 0.70 0.52* 0.63 0.61  CALCIUM 8.2* 8.7* 9.0 8.3* 8.1*  MG  --   --  1.4* 1.8 1.7  PHOS  --   --  3.1 2.9 2.8   Liver Function Tests: Recent Labs  Lab 02/01/20 1530  AST 19  ALT 14  ALKPHOS 104  BILITOT 0.8  PROT 6.1*  ALBUMIN 3.5   No results for input(s): LIPASE, AMYLASE in the last 168 hours. No results for input(s): AMMONIA in the last 168 hours. CBC: Recent Labs  Lab 02/01/20 1530 02/01/20 1530 02/02/20 0550 02/04/20 0447 02/05/20 0812 02/06/20 0719 02/07/20 0548  WBC 8.7   < > 9.4 4.6 4.8 4.1 4.8  NEUTROABS 5.9  --   --   --   --   --   --   HGB 13.8   < > 12.0* 11.1* 11.7* 10.9* 10.9*  HCT 43.6   < > 36.9* 32.8* 35.2* 33.0* 33.3*  MCV 93.6   < > 92.5 88.4 88.7 90.2 90.5  PLT 257   < > 270 164 195 194 188   < > = values in this interval not displayed.   Cardiac Enzymes: No results for input(s): CKTOTAL, CKMB, CKMBINDEX, TROPONINI in the last 168 hours. BNP: Invalid input(s): POCBNP CBG: Recent Labs  Lab 02/06/20 1643 02/06/20 2210 02/07/20 0015 02/07/20 0743 02/07/20 1222  GLUCAP 321* 284* 288* 233* 338*   D-Dimer No results for input(s): DDIMER in the last 72 hours. Hgb A1c No results for input(s): HGBA1C in the last 72 hours. Lipid Profile No results for input(s): CHOL, HDL, LDLCALC, TRIG, CHOLHDL, LDLDIRECT in the last 72 hours. Thyroid function studies Recent Labs  02/04/20 1645 02/05/20 0812  TSH 0.558 1.139  T4TOTAL 7.3  --    Anemia work up No results for input(s): VITAMINB12, FOLATE, FERRITIN, TIBC, IRON, RETICCTPCT in the last 72 hours. Urinalysis    Component Value Date/Time   COLORURINE YELLOW (A) 02/01/2020 1544   APPEARANCEUR TURBID (A) 02/01/2020 1544   APPEARANCEUR HAZY  04/04/2012 1057   LABSPEC 1.017 02/01/2020 1544   LABSPEC >=1.030 04/04/2012 1057   LABSPEC >=1.030 04/04/2012 1057   PHURINE 5.0 02/01/2020 1544   GLUCOSEU 50 (A) 02/01/2020 1544   GLUCOSEU NEGATIVE 04/04/2012 1057   GLUCOSEU NEGATIVE 04/04/2012 1057   HGBUR SMALL (A) 02/01/2020 1544   BILIRUBINUR NEGATIVE 02/01/2020 1544   BILIRUBINUR 2+ 04/04/2012 1057   KETONESUR 5 (A) 02/01/2020 1544   PROTEINUR 100 (A) 02/01/2020 1544   NITRITE NEGATIVE 02/01/2020 1544   LEUKOCYTESUR MODERATE (A) 02/01/2020 1544   LEUKOCYTESUR NEGATIVE 04/04/2012 1057   Sepsis Labs Invalid input(s): PROCALCITONIN,  WBC,  LACTICIDVEN Microbiology Recent Results (from the past 240 hour(s))  Blood Culture (routine x 2)     Status: None   Collection Time: 02/01/20  3:30 PM   Specimen: BLOOD  Result Value Ref Range Status   Specimen Description BLOOD BLOOD LEFT ARM  Final   Special Requests   Final    BOTTLES DRAWN AEROBIC AND ANAEROBIC Blood Culture results may not be optimal due to an inadequate volume of blood received in culture bottles   Culture   Final    NO GROWTH 5 DAYS Performed at [facility], [address] [place], [place] [address]    Report Status 02/06/2020 FINAL  Final  Urine culture     Status: Abnormal   Collection Time: 02/01/20  3:44 PM   Specimen: In/Out Cath Urine  Result Value Ref Range Status   Specimen Description   Final    IN/OUT CATH URINE Performed at [facility], [address], [place], [place] [address]    Special Requests   Final    NONE Performed at [facility], [address] [place], [place] [address]    Culture (A)  Final    >=100,000 COLONIES/mL ESCHERICHIA COLI Confirmed Extended Spectrum Beta-Lactamase Producer (ESBL).  In bloodstream infections from ESBL organisms, carbapenems are preferred over piperacillin/tazobactam. They are shown to have a lower risk of mortality. MULTI-DRUG RESISTANT ORGANISM    Report Status  02/03/2020 FINAL  Final   Organism ID, Bacteria ESCHERICHIA COLI (A)  Final      Susceptibility   Escherichia coli - MIC*    AMPICILLIN >=32 RESISTANT Resistant     CEFAZOLIN >=64 RESISTANT Resistant     CEFTRIAXONE >=64 RESISTANT Resistant     CIPROFLOXACIN >=4 RESISTANT Resistant     GENTAMICIN <=1 SENSITIVE Sensitive     IMIPENEM <=0.25 SENSITIVE Sensitive     NITROFURANTOIN <=16 SENSITIVE Sensitive     TRIMETH/SULFA <=20 SENSITIVE Sensitive     AMPICILLIN/SULBACTAM >=32 RESISTANT Resistant     PIP/TAZO 8 SENSITIVE Sensitive     * >=100,000 COLONIES/mL ESCHERICHIA COLI  SARS Coronavirus 2 by RT PCR (hospital order, performed in [facility] hospital lab) Nasopharyngeal Nasopharyngeal Swab     Status: None   Collection Time: 02/01/20  4:42 PM   Specimen: Nasopharyngeal Swab  Result Value Ref Range Status   SARS Coronavirus 2 NEGATIVE NEGATIVE Final    Comment: (NOTE) SARS-CoV-2 target nucleic acids are NOT DETECTED. The SARS-CoV-2 RNA is generally detectable in upper and lower respiratory  specimens during the acute phase of infection. The lowest concentration of SARS-CoV-2 viral copies this assay can detect is 250 copies / mL. A negative result does not preclude SARS-CoV-2 infection and should not be used as the sole basis for treatment or other patient management decisions.  A negative result may occur with improper specimen collection / handling, submission of specimen other than nasopharyngeal swab, presence of viral mutation(s) within the areas targeted by this assay, and inadequate number of viral copies (<250 copies / mL). A negative result must be combined with clinical observations, patient history, and epidemiological information. Fact Sheet for Patients:   BoilerBrush.com.cy Fact Sheet for Healthcare Providers: https://pope.com/ This test is not yet approved or cleared  by the [place] FDA and has been authorized for  detection and/or diagnosis of SARS-CoV-2 by FDA under an Emergency Use Authorization (EUA).  This EUA will remain in effect (meaning this test can be used) for the duration of the COVID-19 declaration under Section 564(b)(1) of the Act, 21 U.S.C. section 360bbb-3(b)(1), unless the authorization is terminated or revoked sooner. Performed at [facility], [address] [place], [place] [address]   MRSA PCR Screening     Status: Abnormal   Collection Time: 02/02/20  1:35 AM   Specimen: Nasal Mucosa; Nasopharyngeal  Result Value Ref Range Status   MRSA by PCR POSITIVE (A) NEGATIVE Final    Comment:        The GeneXpert MRSA Assay (FDA approved for NASAL specimens only), is one component of a comprehensive MRSA colonization surveillance program. It is not intended to diagnose MRSA infection nor to guide or monitor treatment for MRSA infections. RESULT CALLED TO, READ BACK BY AND VERIFIED WITH: Elon Spanner RN [phone] 02/02/20 HNM Performed at [facility], [address] [place], [place] [address]   Blood Culture (routine x 2)     Status: None   Collection Time: 02/02/20  5:50 AM   Specimen: BLOOD RIGHT WRIST  Result Value Ref Range Status   Specimen Description BLOOD RIGHT WRIST  Final   Special Requests   Final    BOTTLES DRAWN AEROBIC ONLY Blood Culture adequate volume   Culture   Final    NO GROWTH 5 DAYS Performed at [facility], [address], [place], [place] [address]    Report Status 02/07/2020 FINAL  Final     Time coordinating discharge: Over 30 minutes  SIGNED:   Laverna Peace, MD  [facility] 02/07/2020, 1:32 PM Pager   If 7PM-7AM, please contact night-coverage [email or web address] Password [email or web address]

## 2020-02-07 NOTE — Plan of Care (Signed)

## 2020-02-07 NOTE — TOC Transition Note (Addendum)
Transition of Care [facility]) - CM/SW Discharge Note   Patient Details  Name: Alan Newman MRN: [id number] Date of Birth: [birth year]  [facility] CM/SW Contact:  Maud Deed, LCSW Phone Number: 02/07/2020, 12:41 PM   Clinical Narrative:    Pt medically stable for discharge per MD. Pt will be transported to [facility] via EMS. CSW notified pt's spouse of discharge. CSW made nurse and MD aware that pt would need rapid covid test before discharge. Call to report number is [phone], room number is 701. Nurse will call for transport once rapid test results come back.   Final next level of care: Skilled Nursing Facility Barriers to Discharge: No Barriers Identified   Patient Goals and CMS Choice     Choice offered to / list presented to : Spouse  Discharge Placement              Patient chooses bed at: [facility] Patient to be transferred to facility by: EMS Name of family member notified: [name] Patient and family notified of of transfer: 02/07/20  Discharge Plan and Services                                     Social Determinants of Health (SDOH) Interventions     Readmission Risk Interventions No flowsheet data found.

## 2020-02-07 NOTE — Progress Notes (Signed)
0800-Assumed care    1440- Called report to [employer] spoke with [place], LPN.

## 2020-02-11 ENCOUNTER — Encounter: Payer: Self-pay | Admitting: Emergency Medicine

## 2020-02-11 ENCOUNTER — Other Ambulatory Visit: Payer: Self-pay

## 2020-02-11 ENCOUNTER — Emergency Department: Payer: No Typology Code available for payment source

## 2020-02-11 ENCOUNTER — Emergency Department
Admission: EM | Admit: 2020-02-11 | Discharge: 2020-02-11 | Disposition: A | Discharge status: Home / Self Care | Payer: No Typology Code available for payment source | Attending: Emergency Medicine | Admitting: Emergency Medicine

## 2020-02-11 DIAGNOSIS — Z87891 Personal history of nicotine dependence: Secondary | ICD-10-CM | POA: Diagnosis not present

## 2020-02-11 DIAGNOSIS — Z79899 Other long term (current) drug therapy: Secondary | ICD-10-CM | POA: Diagnosis not present

## 2020-02-11 DIAGNOSIS — I9589 Other hypotension: Secondary | ICD-10-CM | POA: Diagnosis not present

## 2020-02-11 DIAGNOSIS — Z794 Long term (current) use of insulin: Secondary | ICD-10-CM | POA: Diagnosis not present

## 2020-02-11 DIAGNOSIS — E119 Type 2 diabetes mellitus without complications: Secondary | ICD-10-CM | POA: Diagnosis not present

## 2020-02-11 DIAGNOSIS — Z7982 Long term (current) use of aspirin: Secondary | ICD-10-CM | POA: Insufficient documentation

## 2020-02-11 DIAGNOSIS — R42 Dizziness and giddiness: Secondary | ICD-10-CM | POA: Diagnosis present

## 2020-02-11 DIAGNOSIS — I10 Essential (primary) hypertension: Secondary | ICD-10-CM | POA: Insufficient documentation

## 2020-02-11 DIAGNOSIS — E860 Dehydration: Secondary | ICD-10-CM

## 2020-02-11 LAB — URINALYSIS, COMPLETE (UACMP) WITH MICROSCOPIC
Bacteria, UA: NONE SEEN
Bilirubin Urine: NEGATIVE
Glucose, UA: NEGATIVE mg/dL
Hgb urine dipstick: NEGATIVE
Ketones, ur: NEGATIVE mg/dL
Leukocytes,Ua: NEGATIVE
Nitrite: NEGATIVE
Protein, ur: NEGATIVE mg/dL
RBC / HPF: >50 RBC/hpf — ABNORMAL HIGH (ref 0–5)
Specific Gravity, Urine: 1.018 (ref 1.005–1.030)
pH: 6 (ref 5.0–8.0)

## 2020-02-11 LAB — CBC WITH DIFFERENTIAL/PLATELET
Abs Immature Granulocytes: 0.02 10*3/uL (ref 0.00–0.07)
Basophils Absolute: 0.1 10*3/uL (ref 0.0–0.1)
Basophils Relative: 1 %
Eosinophils Absolute: 0.7 10*3/uL — ABNORMAL HIGH (ref 0.0–0.5)
Eosinophils Relative: 10 %
HCT: 39 % (ref 39.0–52.0)
Hemoglobin: 12.4 g/dL — ABNORMAL LOW (ref 13.0–17.0)
Immature Granulocytes: 0 %
Lymphocytes Relative: 33 %
Lymphs Abs: 2.4 10*3/uL (ref 0.7–4.0)
MCH: 29.7 pg (ref 26.0–34.0)
MCHC: 31.8 g/dL (ref 30.0–36.0)
MCV: 93.5 fL (ref 80.0–100.0)
Monocytes Absolute: 0.7 10*3/uL (ref 0.1–1.0)
Monocytes Relative: 9 %
Neutro Abs: 3.4 10*3/uL (ref 1.7–7.7)
Neutrophils Relative %: 47 %
Platelets: 202 10*3/uL (ref 150–400)
RBC: 4.17 MIL/uL — ABNORMAL LOW (ref 4.22–5.81)
RDW: 15.2 % (ref 11.5–15.5)
WBC: 7.3 10*3/uL (ref 4.0–10.5)
nRBC: 0 % (ref 0.0–0.2)

## 2020-02-11 LAB — COMPREHENSIVE METABOLIC PANEL
ALT: 23 U/L (ref 0–44)
AST: 16 U/L (ref 15–41)
Albumin: 3.3 g/dL — ABNORMAL LOW (ref 3.5–5.0)
Alkaline Phosphatase: 87 U/L (ref 38–126)
Anion gap: 9 (ref 5–15)
BUN: 19 mg/dL (ref 8–23)
CO2: 27 mmol/L (ref 22–32)
Calcium: 9 mg/dL (ref 8.9–10.3)
Chloride: 102 mmol/L (ref 98–111)
Creatinine, Ser: 0.8 mg/dL (ref 0.61–1.24)
GFR calc Af Amer: >60 mL/min (ref >60)
GFR calc non Af Amer: >60 mL/min (ref >60)
Glucose, Bld: 105 mg/dL — ABNORMAL HIGH (ref 70–99)
Potassium: 5.2 mmol/L — ABNORMAL HIGH (ref 3.5–5.1)
Sodium: 138 mmol/L (ref 135–145)
Total Bilirubin: 0.7 mg/dL (ref 0.3–1.2)
Total Protein: 6.1 g/dL — ABNORMAL LOW (ref 6.5–8.1)

## 2020-02-11 LAB — TROPONIN I (HIGH SENSITIVITY)
Troponin I (High Sensitivity): 4 ng/L (ref <18)
Troponin I (High Sensitivity): 3 ng/L (ref <18)

## 2020-02-11 LAB — LACTIC ACID, PLASMA
Lactic Acid, Venous: 0.8 mmol/L (ref 0.5–1.9)
Lactic Acid, Venous: 1.3 mmol/L (ref 0.5–1.9)

## 2020-02-11 MED ORDER — LACTATED RINGERS IV BOLUS
1000.0000 mL | Freq: Once | INTRAVENOUS | Status: AC
  Administered 2020-02-11: 1000 mL via INTRAVENOUS

## 2020-02-11 NOTE — ED Notes (Signed)
Pt signed d/c paperwork and physical copy sent to records.  

## 2020-02-11 NOTE — ED Triage Notes (Signed)
Pt arrival via [facility] from [facility] nursing facility due to hypotension. EMS states pt has been hypotensive with them in addition to having a UTI for the last 10 days and abd discomfort/n/v.   Pt states he's been feeling sleepy and slightly dizzy when sitting up.   Pt denies fevers and pain.  Dr. Larinda Buttery at bedside.  BP 86/58 HR 72 O2 98% Temp 97.9

## 2020-02-11 NOTE — ED Notes (Signed)
Pt transported to xray 

## 2020-02-11 NOTE — ED Notes (Signed)
Pt states he's here for low bp, states he's slightly dizzy when he sits up or moves quickly. Denies fever. A&Ox4, patient having no current vomiting.

## 2020-02-11 NOTE — ED Provider Notes (Signed)
[facility] Emergency Department Provider Note   ____________________________________________   First MD Initiated Contact with Patient 02/11/20 1623     (approximate)  I have reviewed the triage vital signs and the nursing notes.   HISTORY  Chief Complaint Dizziness and Abdominal Pain    HPI Alan Newman is a 69 y.o. male with past medical history of CAD status post CABG, hypertension, diabetes, CVA, and hydrocephalus status post VP shunt who presents to the ED for hypertension.  Patient reports that he has been feeling generally weak and lightheaded throughout this week and when they checked his blood pressure today at [facility] he was found to be running consistently low.  He states he has otherwise been feeling well, had one episode of vomiting with breakfast but tolerated lunch without difficulty.  He does report regular issues with the eating and drinking, states "I do not drink as much water as I should".  He denies any fevers, cough, chest pain, shortness of breath, abdominal pain, dysuria, hematuria, or diarrhea.  He was recently admitted and treated for UTI, states he has been feeling better after leaving the hospital.        Past Medical History:  Diagnosis Date  . Diabetes mellitus without complication ([place])   . Hypertension   . Stroke [facility])     Patient Active Problem List   Diagnosis Date Noted  . Infection due to ESBL-producing Escherichia coli 02/07/2020  . Hypotension 02/06/2020  . Septic shock ([place]) 02/02/2020  . Severe sepsis ([place]) 02/01/2020  . Dehydration   . UTI (urinary tract infection) 09/10/2019  . Non-intractable vomiting   . HTN (hypertension), malignant   . Leukocytosis   . Goals of care, counseling/discussion   . Palliative care by specialist   . DNR (do not resuscitate) discussion   . Chest pain 06/29/2019  . HTN (hypertension), benign 06/29/2019  . Type 2 diabetes mellitus ([place]) 06/29/2019  . Hyperglycemia  06/29/2019  . Protein-calorie malnutrition, severe 08/30/2018  . AKI (acute kidney injury) ([place]) 08/27/2018    Past Surgical History:  Procedure Laterality Date  . BRAIN SURGERY    . CARDIAC SURGERY      Prior to Admission medications   Medication Sig Start Date End Date Taking? Authorizing Provider  aspirin 81 MG chewable tablet Chew by mouth daily.    [provider]  atorvastatin (LIPITOR) 40 MG tablet Take 40 mg by mouth daily.    [provider]  folic acid (FOLVITE) 1 MG tablet Take 1 mg by mouth daily.    [provider]  gabapentin (NEURONTIN) 100 MG capsule Take 200 mg by mouth daily.    [provider]  hyoscyamine (LEVSIN) 0.125 MG tablet Take 0.125 mg by mouth every 3 (three) hours as needed (secretions).     [provider]  insulin glargine (LANTUS) 100 unit/mL SOPN Inject 0.15 mLs (15 Units total) into the skin daily. 07/02/19   Vaughan Basta, MD  lisinopril (ZESTRIL) 10 MG tablet Take 10 mg by mouth daily.     [provider]  LORazepam (ATIVAN) 0.5 MG tablet Take 0.5 mg by mouth every 6 (six) hours as needed for anxiety.    [provider]  magnesium oxide (MAG-OX) 400 MG tablet Take 400 mg by mouth daily.    [provider]  metFORMIN (GLUCOPHAGE) 1000 MG tablet Take 1,000 mg by mouth 2 (two) times daily.     [provider]  morphine (MS CONTIN)  30 MG 12 hr tablet Take 30 mg by mouth every 12 (twelve) hours.    [provider]  Morphine Sulfate (MORPHINE CONCENTRATE) 10 mg / 0.5 ml concentrated solution Take 5 mg by mouth every 2 (two) hours as needed for severe pain.    [provider]  ondansetron (ZOFRAN) 4 MG tablet Take 4 mg by mouth 3 (three) times daily as needed for nausea.     [provider]  ondansetron (ZOFRAN) 4 MG tablet Take 4 mg by mouth 2 (two) times daily.    [provider]  polyethylene glycol (MIRALAX / GLYCOLAX) 17 g packet  Take 17 g by mouth daily.    [provider]  tamsulosin (FLOMAX) 0.4 MG CAPS capsule Take 0.4 mg by mouth daily.     [provider]  traZODone (DESYREL) 50 MG tablet Take 50 mg by mouth at bedtime.    [provider]    Allergies Patient has no known allergies.  Family History  Problem Relation Age of Onset  . Kidney disease Mother   . Dementia Father   . Cancer Father   . Stomach cancer Sister     Social History Social History   Tobacco Use  . Smoking status: Never Smoker  . Smokeless tobacco: Former Neurosurgeon    Types: Chew  Substance Use Topics  . Alcohol use: Never  . Drug use: Never    Review of Systems  Constitutional: No fever/chills.  Positive for generalized weakness and lightheadedness. Eyes: No visual changes. ENT: No sore throat. Cardiovascular: Denies chest pain. Respiratory: Denies shortness of breath. Gastrointestinal: No abdominal pain.  Positive for nausea and vomiting.  No diarrhea.  No constipation. Genitourinary: Negative for dysuria. Musculoskeletal: Negative for back pain. Skin: Negative for rash. Neurological: Negative for headaches, focal weakness or numbness.  ____________________________________________   PHYSICAL EXAM:  VITAL SIGNS: ED Triage Vitals  Enc Vitals Group     BP 02/11/20 1629 (!) 76/59     Pulse Rate 02/11/20 1629 81     Resp 02/11/20 1629 16     Temp 02/11/20 1629 98.2 F (36.8 C)     Temp Source 02/11/20 1629 Oral     SpO2 02/11/20 1629 98 %     Weight 02/11/20 1631 149 lb 14.6 oz (68 kg)     Height 02/11/20 1631 5\' 9"  (1.753 m)     Head Circumference --      Peak Flow --      Pain Score 02/11/20 1631 0     Pain Loc --      Pain Edu? --      Excl. in GC? --     Constitutional: Alert and oriented.  Thin and cachectic appearing. Eyes: Conjunctivae are normal. Head: Atraumatic. Nose: No congestion/rhinnorhea. Mouth/Throat: Mucous membranes are dry. Neck: Normal ROM Cardiovascular:  Normal rate, regular rhythm. Grossly normal heart sounds. Respiratory: Normal respiratory effort.  No retractions. Lungs CTAB. Gastrointestinal: Soft and nontender. No distention. Genitourinary: deferred Musculoskeletal: No lower extremity tenderness nor edema. Neurologic:  Normal speech and language. No gross focal neurologic deficits are appreciated. Skin:  Skin is warm, dry and intact. No rash noted. Psychiatric: Mood and affect are normal. Speech and behavior are normal.  ____________________________________________   LABS (all labs ordered are listed, but only abnormal results are displayed)  Labs Reviewed  CBC WITH DIFFERENTIAL/PLATELET - Abnormal; Notable for the following components:      Result Value   RBC 4.17 (*)  Hemoglobin 12.4 (*)    Eosinophils Absolute 0.7 (*)    All other components within normal limits  COMPREHENSIVE METABOLIC PANEL - Abnormal; Notable for the following components:   Potassium 5.2 (*)    Glucose, Bld 105 (*)    Total Protein 6.1 (*)    Albumin 3.3 (*)    All other components within normal limits  URINALYSIS, COMPLETE (UACMP) WITH MICROSCOPIC - Abnormal; Notable for the following components:   Color, Urine YELLOW (*)    APPearance CLOUDY (*)    RBC / HPF >50 (*)    All other components within normal limits  LACTIC ACID, PLASMA  LACTIC ACID, PLASMA  TROPONIN I (HIGH SENSITIVITY)  TROPONIN I (HIGH SENSITIVITY)   ____________________________________________  EKG  ED ECG REPORT I, Chesley Noon, the attending physician, personally viewed and interpreted this ECG.   Date: 02/11/2020  EKG Time: 16:57  Rate: 81  Rhythm: normal sinus rhythm  Axis: Normal  Intervals:none  ST&T Change: None   PROCEDURES  Procedure(s) performed (including Critical Care):  Procedures   ____________________________________________   INITIAL IMPRESSION / ASSESSMENT AND PLAN / ED COURSE       69 year old male with history of CAD status post  CABG, hypertension, diabetes, stroke, and hydrocephalus status post VP shunt who presents to the ED for generalized weakness and hypotension noted at his nursing facility.  He overall appears well and given his reported poor p.o. intake, dehydration seems to be the most likely etiology of his hypotension.  However, he did have recent admission for septic shock secondary to UTI 10 days prior.  Vital signs are not currently consistent with sepsis and there is no obvious infectious etiology, we will hold off on antibiotics for now unless antibiotic source becomes clear.  Plan to screen chest x-ray, UA, labs, and EKG.  EKG shows no evidence of arrhythmia or ischemia, chest x-ray and UA show no potential source of infection.  Lab work is unremarkable.  Patient reports feeling well at this time and his blood pressure is slightly improved, now with MAP consistently greater than 65.  I suspect his borderline low blood pressure is due to dehydration and poor p.o. intake.  He has had longstanding issues with poor p.o. intake in the past, often borderline hypotensive here in the hospital.  At this point, he seems appropriate for discharge back home.  I have counseled him to drink plenty of water and follow-up closely with his PCP.  He will receive regular blood pressure checks at his nursing facility.  Patient agrees with plan.      ____________________________________________   FINAL CLINICAL IMPRESSION(S) / ED DIAGNOSES  Final diagnoses:  Dehydration  Hypotension due to hypovolemia     ED Discharge Orders    None       Note:  This document was prepared using Dragon voice recognition software and may include unintentional dictation errors.   Chesley Noon, MD 02/11/20 2149

## 2020-02-12 NOTE — ED Notes (Signed)
This RN called [facility] x3 in order to give report. After reaching them, this RN gave report to a staff member and alerted them of patients current condition including low BP but that he is stable and needs to drink more fluids per Dr. Larinda Buttery.  [facility] took report and states no concerns at this time.

## 2020-05-25 DEATH — deceased

## 2020-11-22 DEATH — deceased

## 2020-12-25 IMAGING — DX DG CHEST 1V PORT
1 series · 1 of 1 positions shown · non-contrast
Comparison: June 28, 2019

CLINICAL DATA: Hypoxia.

EXAM:
PORTABLE CHEST 1 VIEW

[chest ap]
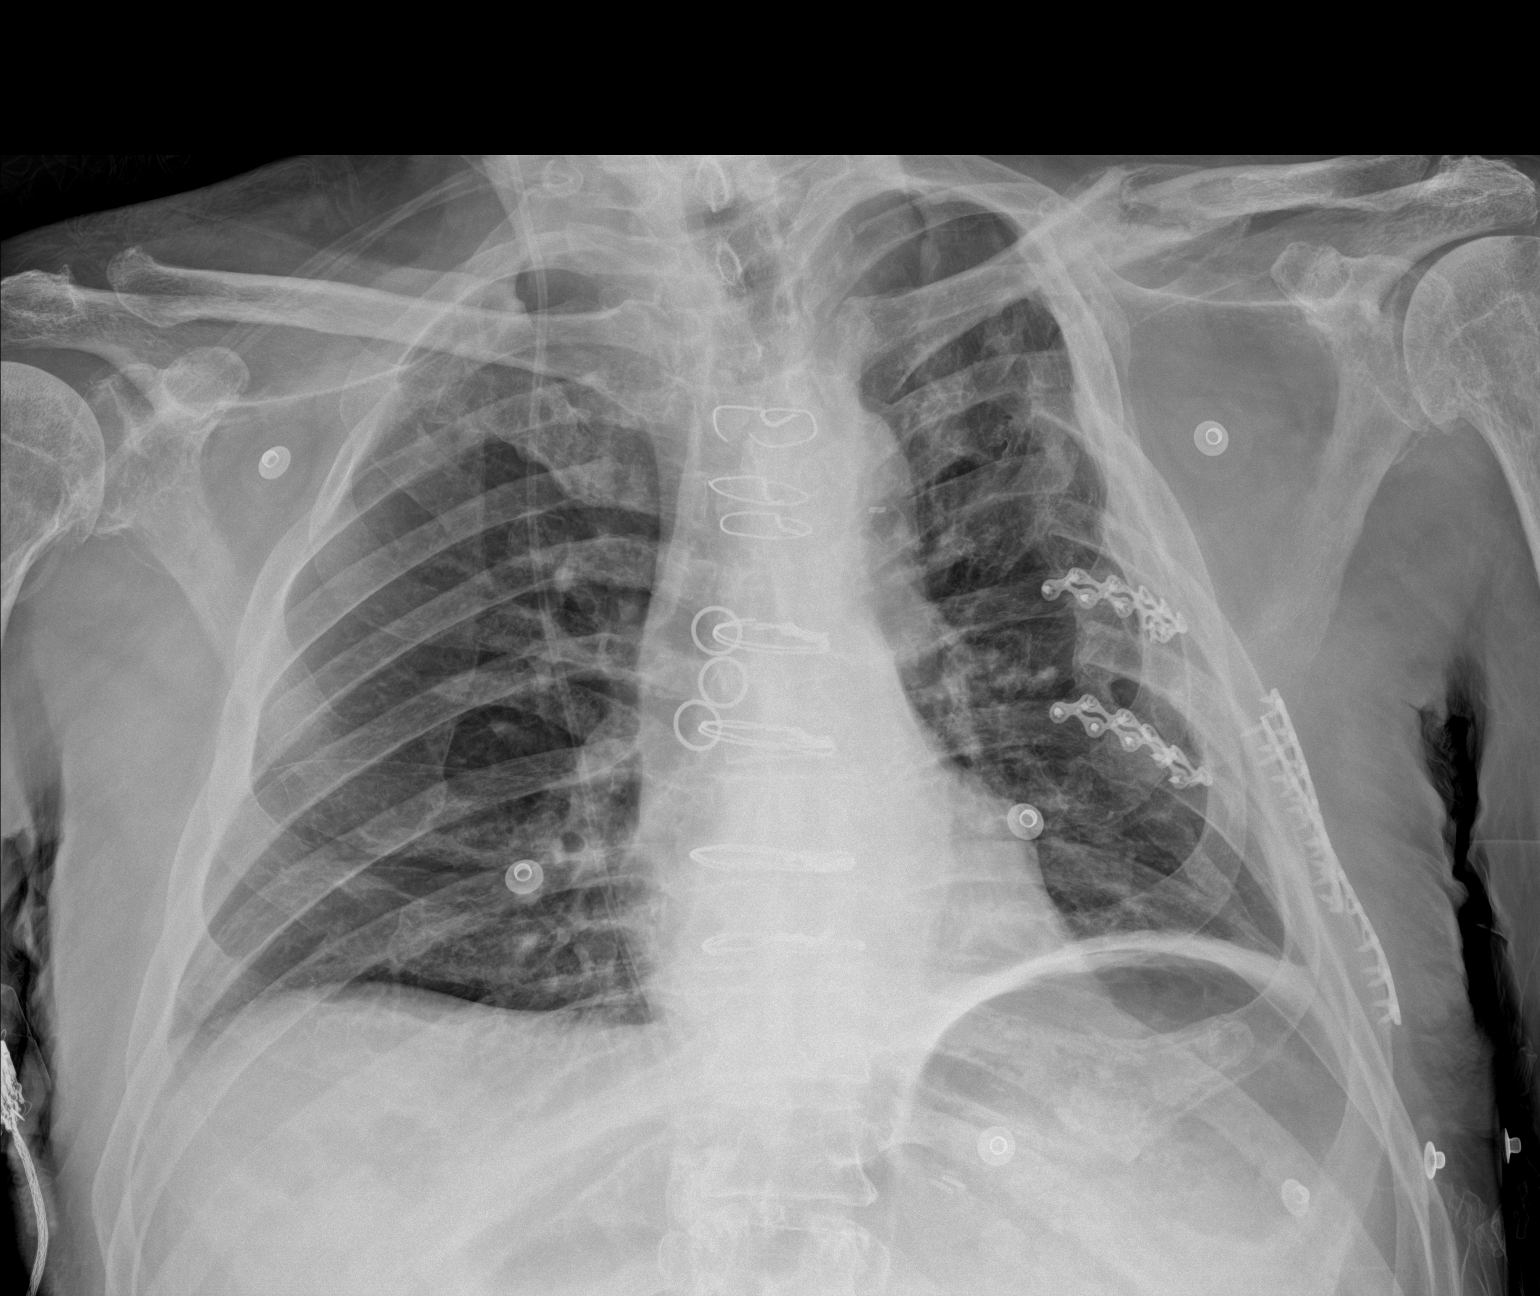

[1 of 1 positions shown; findings below may reference images not displayed]

FINDINGS: Multiple sternal wires are seen. Radiopaque fixation plates and
screws are seen overlying the anterior and lateral aspects of
multiple left ribs. Additional chronic rib deformities are seen on
the left. There is no evidence of acute infiltrate, pleural effusion
or pneumothorax. The heart size and mediastinal contours are within
normal limits. Multilevel degenerative changes seen throughout the
thoracic spine.
IMPRESSION: 1. Evidence of prior median sternotomy/CABG.
2. Multiple chronic left-sided rib deformities.

## 2020-12-28 IMAGING — US US RENAL
1 series · 14 of 25 positions shown · non-contrast
Comparison: August 27, 2018.

CLINICAL DATA: Urinary tract infection.

EXAM:
RENAL / URINARY TRACT ULTRASOUND COMPLETE

[Series 1: us renal · 14 of 34 slices shown]
[im 1/34]
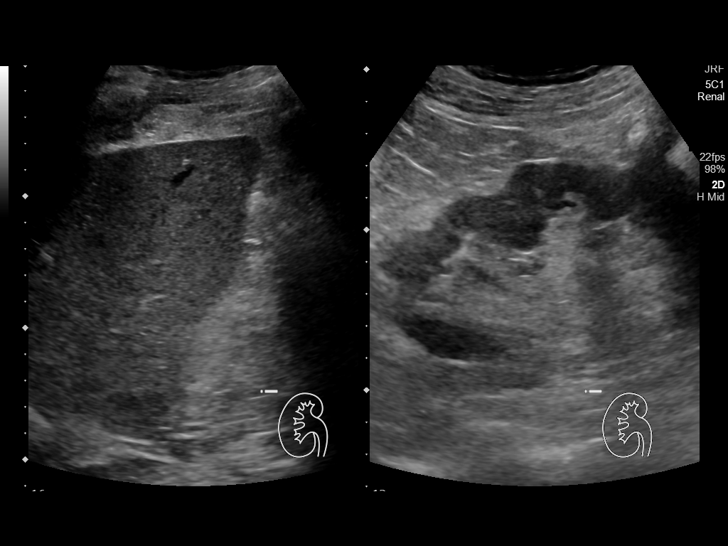
[im 3/34]
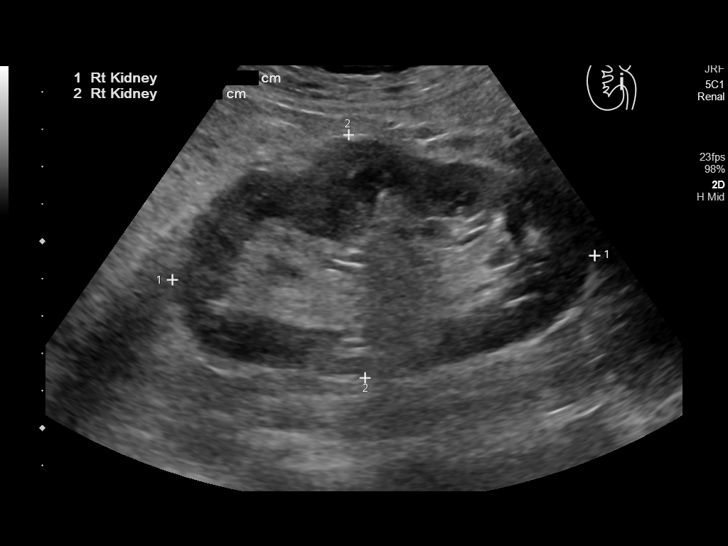
[im 6/34]
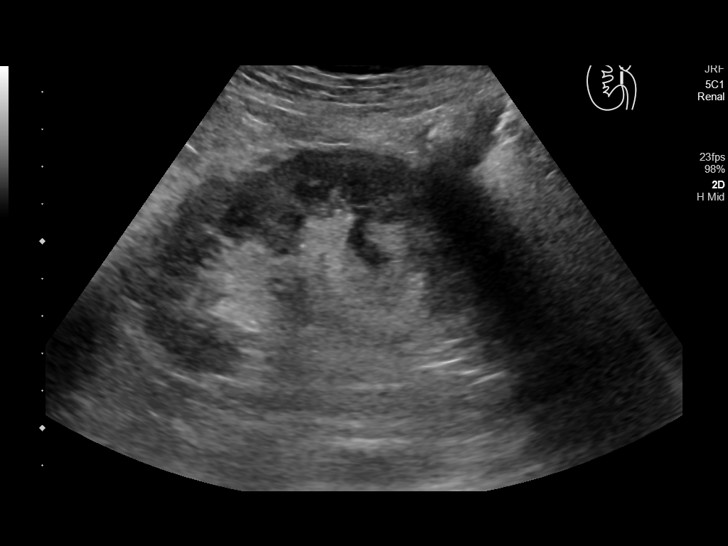
[im 9/34]
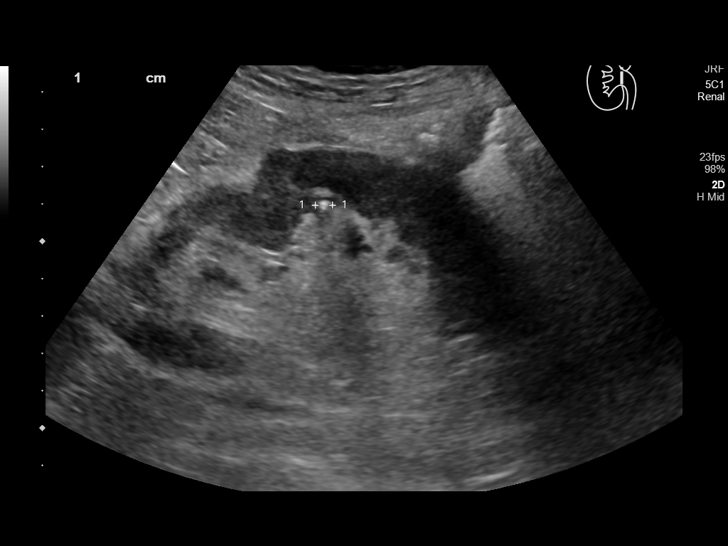
[im 12/34]
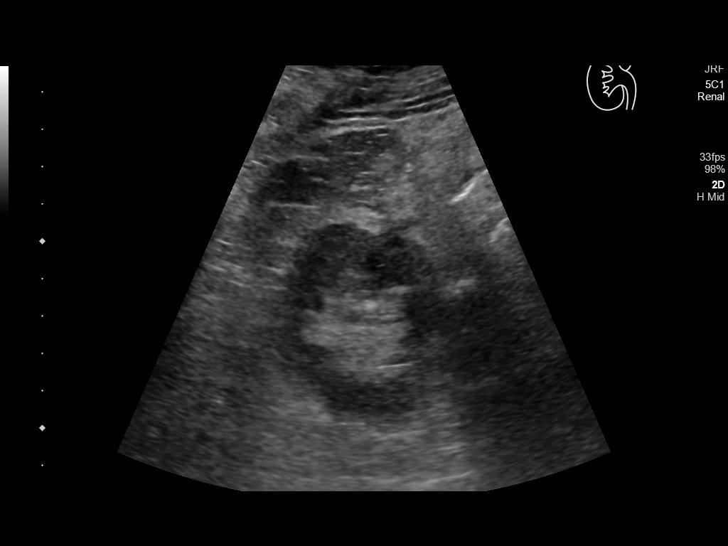
[im 13/34]
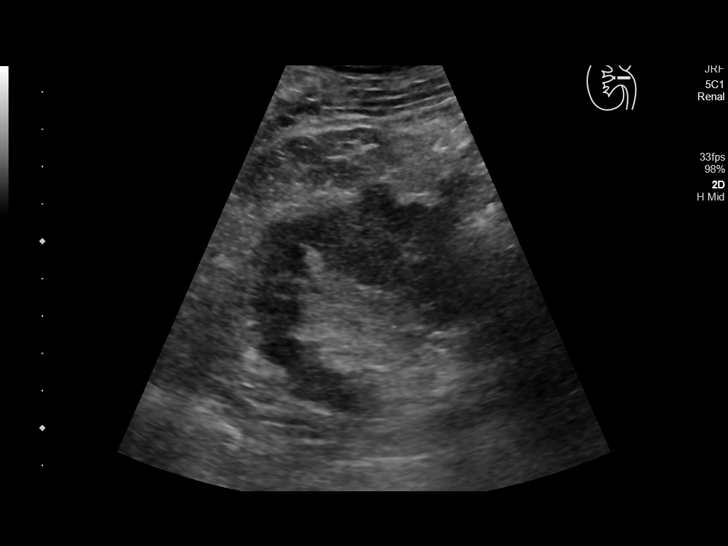
[im 16/34]
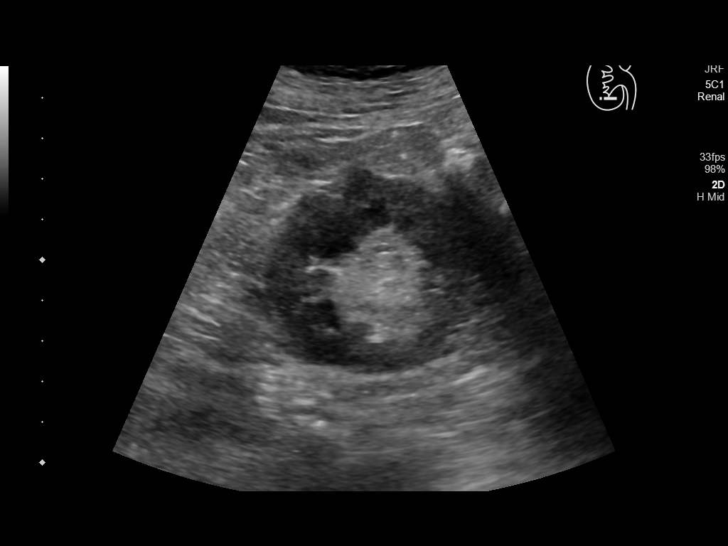
[im 18/34]
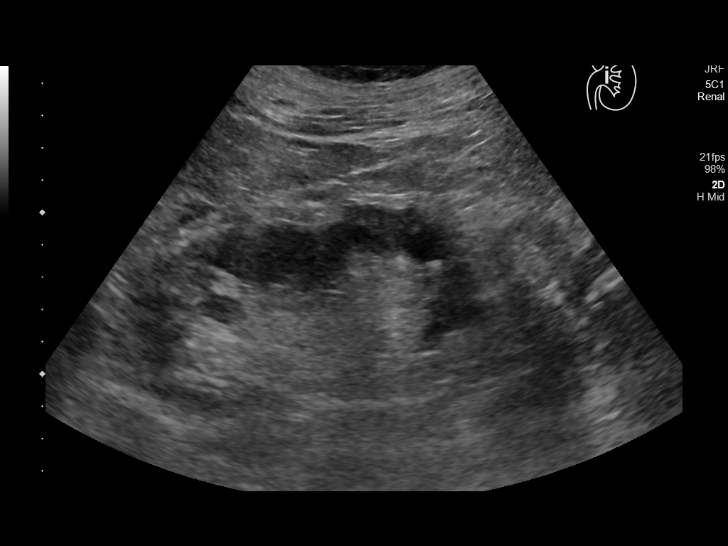
[im 21/34]
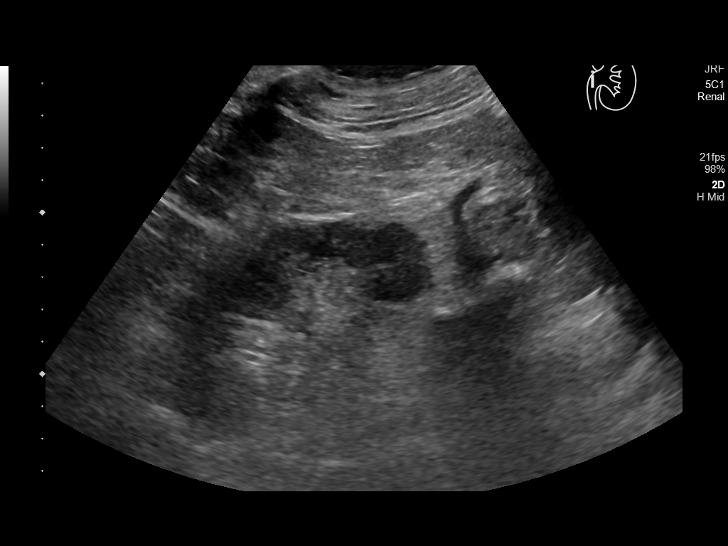
[im 23/34]
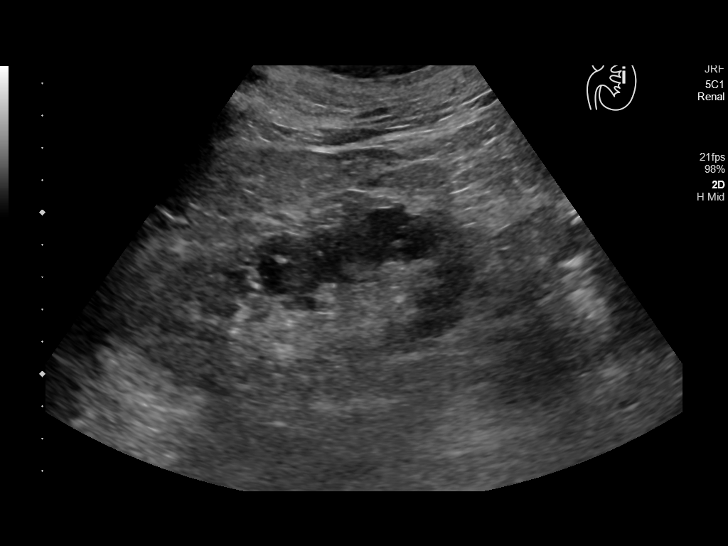
[im 25/34]
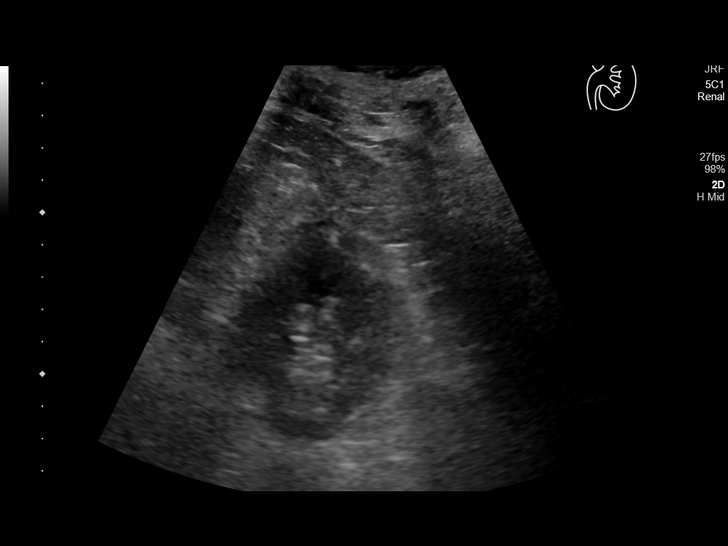
[im 28/34]
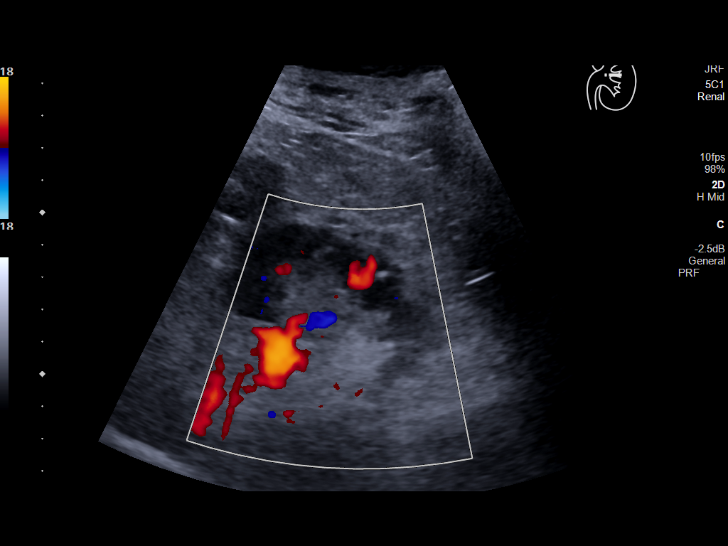
[im 31/34]
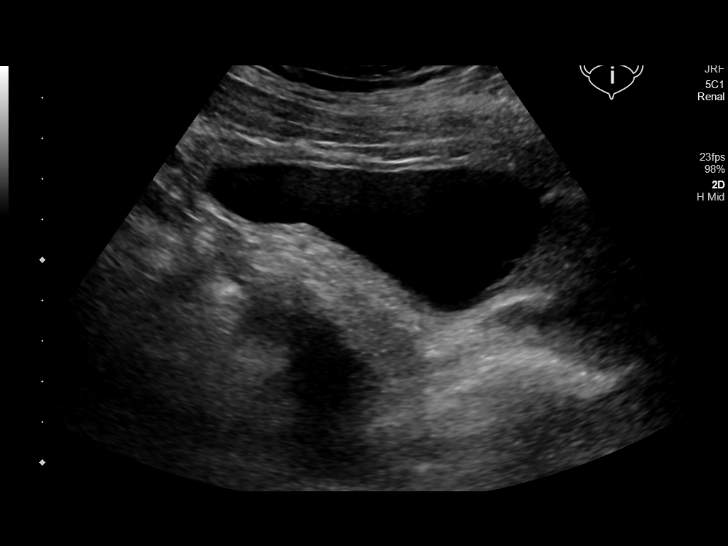
[im 34/34]
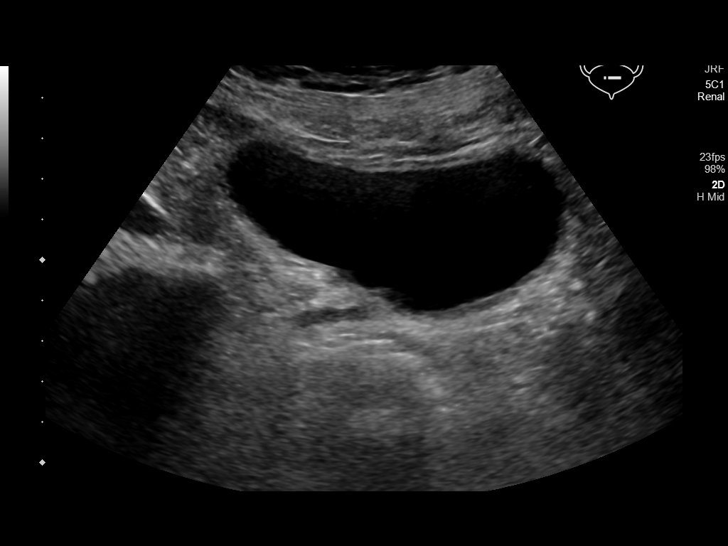

[14 of 25 positions shown; findings below may reference images not displayed]

FINDINGS: Right Kidney:

Renal measurements: 11.3 x 6.5 x 5.5 cm = volume: 211 mL. 5 mm
nonobstructive calculus is seen in midpole. Echogenicity within
normal limits. No mass or hydronephrosis visualized.

Left Kidney:

Renal measurements: 11.5 x 5.7 x 5.3 cm = volume: 181 mL.
Echogenicity within normal limits. No mass or hydronephrosis
visualized.

Bladder:

Appears normal for degree of bladder distention.

Other:

None.
IMPRESSION: Nonobstructive right renal calculus. No other renal abnormality is
noted.

## 2021-01-04 IMAGING — CR DG CHEST 2V
2 series · 2 of 2 positions shown · non-contrast
Comparison: 02/01/2020

CLINICAL DATA: Hypotension

EXAM:
CHEST - 2 VIEW

[chest lat]
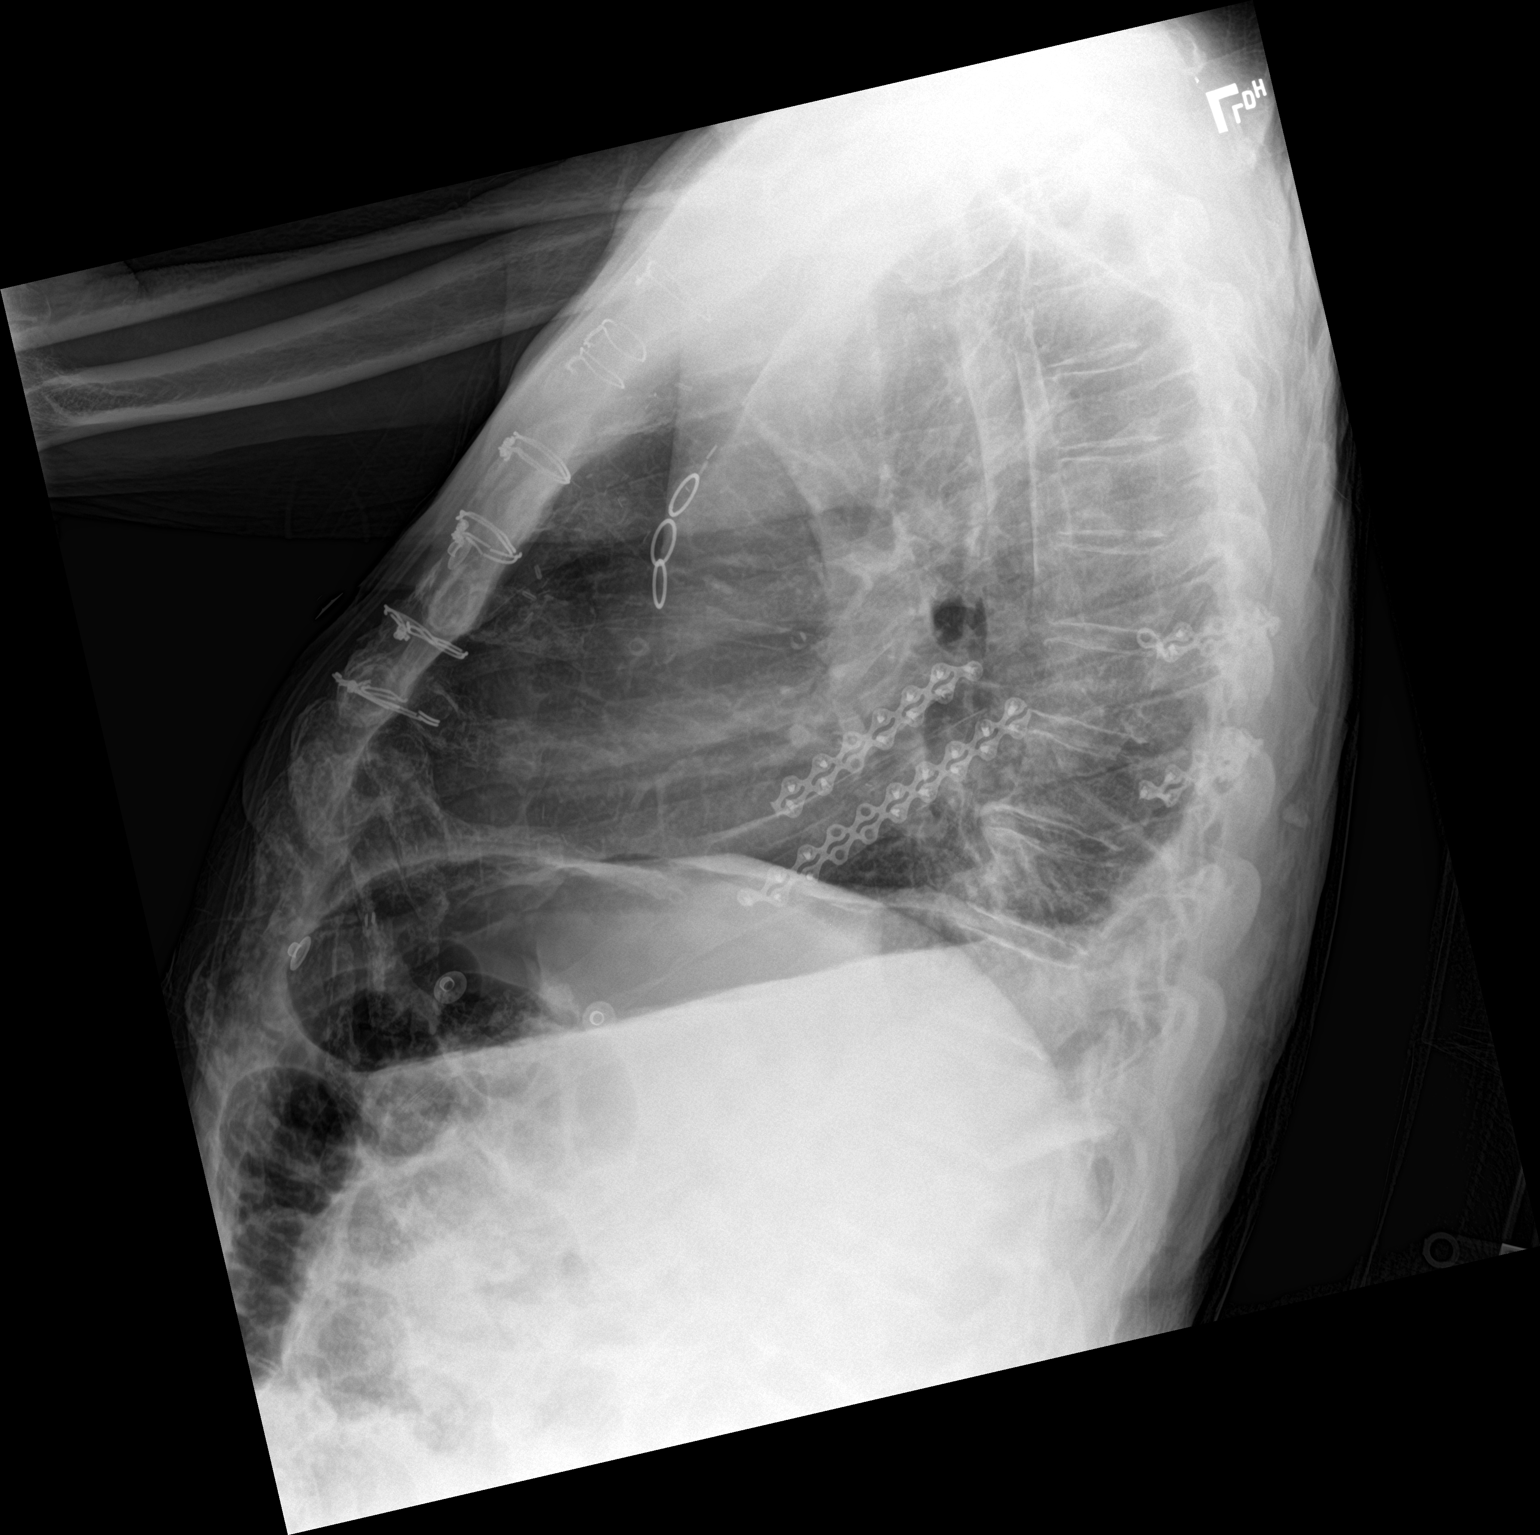

[chest ap]
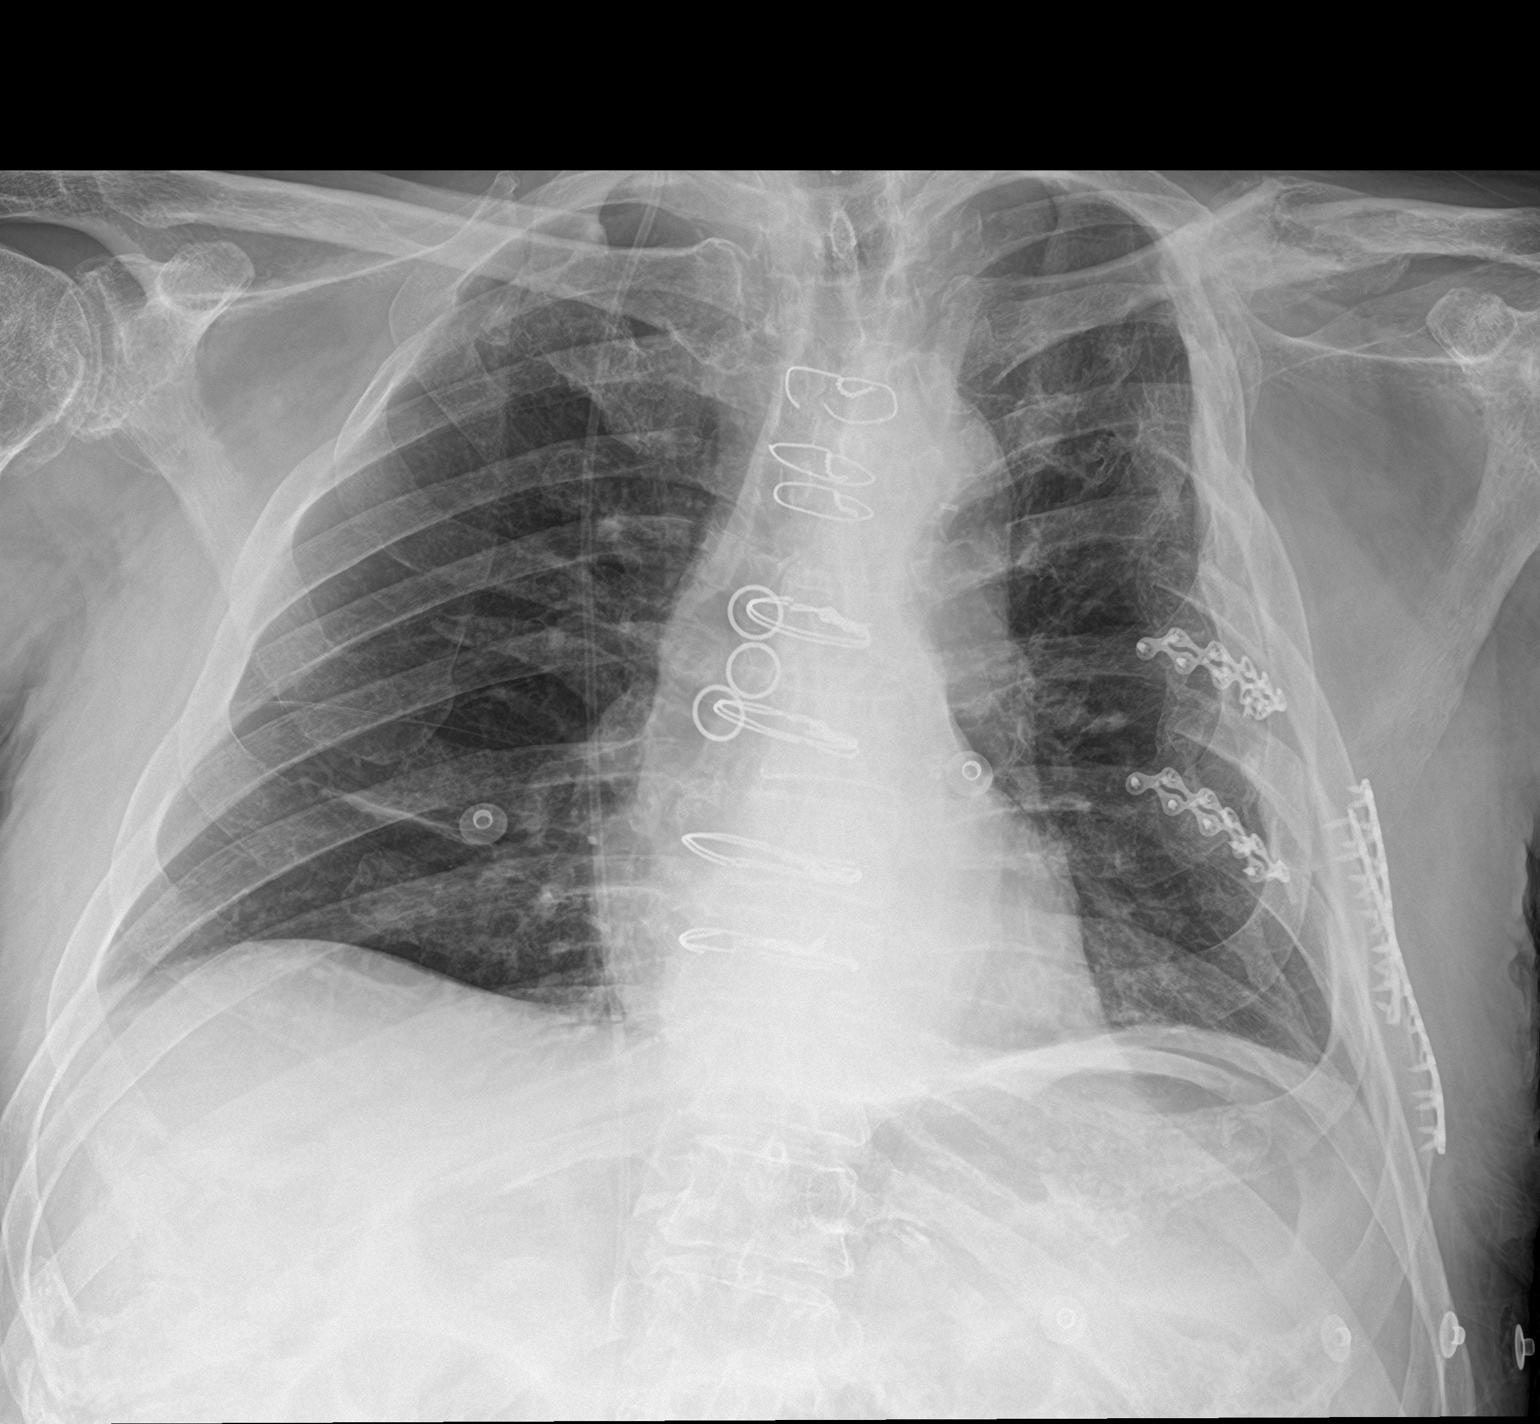

[2 of 2 positions shown; findings below may reference images not displayed]

FINDINGS: Old rib fractures and findings of remote median sternotomy. There is
a shunt catheter overlying the right chest. Cardiomediastinal
contours are normal. No focal airspace consolidation. No pleural
effusion or pneumothorax.
IMPRESSION: No active cardiopulmonary disease.
